# Patient Record
Sex: Female | Born: 1955
Health system: Southern US, Community
[De-identification: ages and names within clinical notes are randomized; demographics above are authoritative.]

## PROBLEM LIST (undated history)

## (undated) DIAGNOSIS — T7840XA Allergy, unspecified, initial encounter: Secondary | ICD-10-CM

## (undated) DIAGNOSIS — K219 Gastro-esophageal reflux disease without esophagitis: Secondary | ICD-10-CM

## (undated) DIAGNOSIS — I10 Essential (primary) hypertension: Secondary | ICD-10-CM

## (undated) DIAGNOSIS — H269 Unspecified cataract: Secondary | ICD-10-CM

## (undated) HISTORY — DX: Unspecified cataract: H26.9

## (undated) HISTORY — PX: TUMOR REMOVAL: SHX12

## (undated) HISTORY — DX: Allergy, unspecified, initial encounter: T78.40XA

## (undated) HISTORY — PX: EYE SURGERY: SHX253

## (undated) HISTORY — PX: APPENDECTOMY: SHX54

## (undated) HISTORY — PX: OTHER SURGICAL HISTORY: SHX169

## (undated) HISTORY — PX: ELBOW SURGERY: SHX618

## (undated) HISTORY — PX: ABDOMINAL HYSTERECTOMY: SHX81

## (undated) HISTORY — PX: JOINT REPLACEMENT: SHX530

## (undated) HISTORY — PX: ANKLE SURGERY: SHX546

## (undated) HISTORY — PX: CHOLECYSTECTOMY: SHX55

---

## 2001-08-16 HISTORY — PX: TOTAL ABDOMINAL HYSTERECTOMY W/ BILATERAL SALPINGOOPHORECTOMY: SHX83

## 2004-11-23 ENCOUNTER — Ambulatory Visit: Payer: Self-pay | Admitting: Pain Medicine

## 2005-03-19 ENCOUNTER — Ambulatory Visit: Payer: Self-pay | Admitting: Family Medicine

## 2005-04-21 ENCOUNTER — Ambulatory Visit: Payer: Self-pay | Admitting: Family Medicine

## 2005-05-04 ENCOUNTER — Ambulatory Visit: Payer: Self-pay | Admitting: Family Medicine

## 2005-07-30 ENCOUNTER — Ambulatory Visit: Payer: Self-pay | Admitting: Gastroenterology

## 2005-08-18 ENCOUNTER — Ambulatory Visit: Payer: Self-pay | Admitting: Gastroenterology

## 2006-09-16 ENCOUNTER — Ambulatory Visit: Payer: Self-pay | Admitting: Family Medicine

## 2008-10-23 ENCOUNTER — Ambulatory Visit: Payer: Self-pay | Admitting: Gastroenterology

## 2008-10-23 LAB — HM COLONOSCOPY

## 2008-10-28 ENCOUNTER — Ambulatory Visit: Payer: Self-pay | Admitting: General Surgery

## 2013-01-12 ENCOUNTER — Encounter (INDEPENDENT_AMBULATORY_CARE_PROVIDER_SITE_OTHER): Payer: BC Managed Care – PPO | Admitting: Ophthalmology

## 2013-01-12 DIAGNOSIS — H3530 Unspecified macular degeneration: Secondary | ICD-10-CM

## 2013-01-12 DIAGNOSIS — H43819 Vitreous degeneration, unspecified eye: Secondary | ICD-10-CM

## 2013-01-12 DIAGNOSIS — D3130 Benign neoplasm of unspecified choroid: Secondary | ICD-10-CM

## 2013-01-12 DIAGNOSIS — H2510 Age-related nuclear cataract, unspecified eye: Secondary | ICD-10-CM

## 2015-05-19 ENCOUNTER — Ambulatory Visit (INDEPENDENT_AMBULATORY_CARE_PROVIDER_SITE_OTHER): Payer: 59 | Admitting: Family Medicine

## 2015-05-19 ENCOUNTER — Encounter: Payer: Self-pay | Admitting: Family Medicine

## 2015-05-19 VITALS — BP 170/96 | HR 73 | Temp 98.8°F | Resp 14 | Wt 187.0 lb

## 2015-05-19 DIAGNOSIS — I10 Essential (primary) hypertension: Secondary | ICD-10-CM | POA: Insufficient documentation

## 2015-05-19 DIAGNOSIS — R0789 Other chest pain: Secondary | ICD-10-CM | POA: Insufficient documentation

## 2015-05-19 MED ORDER — LISINOPRIL 5 MG PO TABS: 5.0000 mg | ORAL_TABLET | Freq: Every day | ORAL | Status: DC

## 2015-05-19 NOTE — Progress Notes (Signed)
Patient ID: Karen Boyd, female   DOB: [birth year], 58 y.o.   MRN: [id number]   [name] Female    DOB: [birth year]   60 y.o.   MRN: [id number] Visit Date: 05/19/2015  Today's Provider: Vernie Murders, PA   Chief Complaint  Patient presents with  . Hypertension   Subjective:    Hypertension This is a new problem. The problem is unchanged. Associated symptoms include headaches. Risk factors for coronary artery disease include family history.   Patient reports she has been taking blood pressure at home. Reading have been 130-140's/80-90's. Patient has cut back on sodas and processed foods. Patient eats a low salt diet. Patient states she went to Urgent Care last week and her blood pressure was 160's/100's.    Previous Medications   NAPROXEN SODIUM (ALEVE) 220 MG TABLET    Take by mouth.   RANITIDINE (ZANTAC) 150 MG TABLET    Take by mouth.   Allergies  Allergen Reactions  . Shellfish Allergy Anaphylaxis    Review of Systems  Constitutional: Negative.   Eyes: Negative.   Respiratory: Negative.   Cardiovascular: Negative.   Gastrointestinal: Negative.   Endocrine: Negative.   Genitourinary: Negative.   Musculoskeletal: Negative.   Skin: Negative.   Allergic/Immunologic: Negative.   Neurological: Positive for headaches.  Hematological: Negative.   Psychiatric/Behavioral: Negative.     Social History  Substance Use Topics  . Smoking status: Never Smoker   . Smokeless tobacco: Never Used  . Alcohol Use: No   Objective:   BP 170/96 mmHg  Pulse 73  Temp(Src) 98.8 F (37.1 C) (Oral)  Resp 14  Wt 187 lb (84.823 kg)   Physical Exam  Constitutional: She is oriented to person, place, and time. She appears well-developed and well-nourished. No distress.  HENT:  Head: Normocephalic and atraumatic.  Right Ear: Hearing normal.  Left Ear: Hearing normal.  Nose: Nose normal.  Eyes: Conjunctivae and lids are normal. Right eye exhibits no discharge.  Left eye exhibits no discharge. No scleral icterus.  Cardiovascular: Normal rate and regular rhythm.   Pulmonary/Chest: Effort normal and breath sounds normal. No respiratory distress.  Abdominal: Bowel sounds are normal. She exhibits no mass.  Musculoskeletal: Normal range of motion. She exhibits tenderness.  Neurological: She is alert and oriented to person, place, and time.  Skin: Skin is intact. No lesion and no rash noted.  Psychiatric: She has a normal mood and affect. Her speech is normal and behavior is normal. Thought content normal.      Assessment & Plan:     1. Essential (primary) hypertension Has been trying to limit sodium intake, restrict fat content of diet, lose weight, exercise and lower caffeine consumption. Fell in a ditch walking at night from a neighbor's house and contused/sprained the left wrist. Martin Majestic to a walk-in clinic over the weekend and found BP was elevated (x-ray negative for fracture). Will start ACE inhibitor and get labs. Had cardiology evaluation Sept. 2016 but had decided to postpone stress test and echocardiogram. Denies dyspnea, edema or chest pains. Continues ASA 81 mg qd. Recheck BP in 2 weeks. - CBC with Differential/Platelet - Comprehensive metabolic panel - lisinopril (PRINIVIL,ZESTRIL) 5 MG tablet; Take 1 tablet (5 mg total) by mouth daily.  Dispense: 30 tablet; Refill: 3

## 2015-05-19 NOTE — Patient Instructions (Signed)
Hypertension Hypertension, commonly called high blood pressure, is when the force of blood pumping through your arteries is too strong. Your arteries are the blood vessels that carry blood from your heart throughout your body. A blood pressure reading consists of a higher number over a lower number, such as 110/72. The higher number (systolic) is the pressure inside your arteries when your heart pumps. The lower number (diastolic) is the pressure inside your arteries when your heart relaxes. Ideally you want your blood pressure below 120/80. Hypertension forces your heart to work harder to pump blood. Your arteries may become narrow or stiff. Having untreated or uncontrolled hypertension can cause heart attack, stroke, kidney disease, and other problems. RISK FACTORS Some risk factors for high blood pressure are controllable. Others are not.  Risk factors you cannot control include:   Race. You may be at higher risk if you are African American.  Age. Risk increases with age.  Gender. Men are at higher risk than women before age 45 years. After age 65, women are at higher risk than men. Risk factors you can control include:  Not getting enough exercise or physical activity.  Being overweight.  Getting too much fat, sugar, calories, or salt in your diet.  Drinking too much alcohol. SIGNS AND SYMPTOMS Hypertension does not usually cause signs or symptoms. Extremely high blood pressure (hypertensive crisis) may cause headache, anxiety, shortness of breath, and nosebleed. DIAGNOSIS To check if you have hypertension, your health care provider will measure your blood pressure while you are seated, with your arm held at the level of your heart. It should be measured at least twice using the same arm. Certain conditions can cause a difference in blood pressure between your right and left arms. A blood pressure reading that is higher than normal on one occasion does not mean that you need treatment. If  it is not clear whether you have high blood pressure, you may be asked to return on a different day to have your blood pressure checked again. Or, you may be asked to monitor your blood pressure at home for 1 or more weeks. TREATMENT Treating high blood pressure includes making lifestyle changes and possibly taking medicine. Living a healthy lifestyle can help lower high blood pressure. You may need to change some of your habits. Lifestyle changes may include:  Following the DASH diet. This diet is high in fruits, vegetables, and whole grains. It is low in salt, red meat, and added sugars.  Keep your sodium intake below 2,300 mg per day.  Getting at least 30-45 minutes of aerobic exercise at least 4 times per week.  Losing weight if necessary.  Not smoking.  Limiting alcoholic beverages.  Learning ways to reduce stress. Your health care provider may prescribe medicine if lifestyle changes are not enough to get your blood pressure under control, and if one of the following is true:  You are 18-59 years of age and your systolic blood pressure is above 140.  You are 60 years of age or older, and your systolic blood pressure is above 150.  Your diastolic blood pressure is above 90.  You have diabetes, and your systolic blood pressure is over 140 or your diastolic blood pressure is over 90.  You have kidney disease and your blood pressure is above 140/90.  You have heart disease and your blood pressure is above 140/90. Your personal target blood pressure may vary depending on your medical conditions, your age, and other factors. HOME CARE INSTRUCTIONS    Have your blood pressure rechecked as directed by your health care provider.   Take medicines only as directed by your health care provider. Follow the directions carefully. Blood pressure medicines must be taken as prescribed. The medicine does not work as well when you skip doses. Skipping doses also puts you at risk for  problems.  Do not smoke.   Monitor your blood pressure at home as directed by your health care provider. SEEK MEDICAL CARE IF:   You think you are having a reaction to medicines taken.  You have recurrent headaches or feel dizzy.  You have swelling in your ankles.  You have trouble with your vision. SEEK IMMEDIATE MEDICAL CARE IF:  You develop a severe headache or confusion.  You have unusual weakness, numbness, or feel faint.  You have severe chest or abdominal pain.  You vomit repeatedly.  You have trouble breathing. MAKE SURE YOU:   Understand these instructions.  Will watch your condition.  Will get help right away if you are not doing well or get worse.   This information is not intended to replace advice given to you by your health care provider. Make sure you discuss any questions you have with your health care provider.   Document Released: 01/18/2005 Document Revised: 06/04/2014 Document Reviewed: 11/10/2012 Elsevier Interactive Patient Education 2016 [employer]  

## 2015-05-21 LAB — CBC WITH DIFFERENTIAL/PLATELET
BASOS ABS: 0 10*3/uL (ref 0.0–0.2)
Basos: 0 %
EOS (ABSOLUTE): 0.2 10*3/uL (ref 0.0–0.4)
EOS: 3 %
HEMOGLOBIN: 12.5 g/dL (ref 11.1–15.9)
Hematocrit: 36.7 % (ref 34.0–46.6)
IMMATURE GRANS (ABS): 0 10*3/uL (ref 0.0–0.1)
Immature Granulocytes: 0 %
LYMPHS: 41 %
Lymphocytes Absolute: 2.3 10*3/uL (ref 0.7–3.1)
MCH: 29.9 pg (ref 26.6–33.0)
MCHC: 34.1 g/dL (ref 31.5–35.7)
MCV: 88 fL (ref 79–97)
MONOCYTES: 7 %
Monocytes Absolute: 0.4 10*3/uL (ref 0.1–0.9)
Neutrophils Absolute: 2.7 10*3/uL (ref 1.4–7.0)
Neutrophils: 49 %
Platelets: 350 10*3/uL (ref 150–379)
RBC: 4.18 x10E6/uL (ref 3.77–5.28)
RDW: 13.4 % (ref 12.3–15.4)
WBC: 5.5 10*3/uL (ref 3.4–10.8)

## 2015-05-21 LAB — COMPREHENSIVE METABOLIC PANEL
A/G RATIO: 1.4 (ref 1.2–2.2)
ALBUMIN: 4.2 g/dL (ref 3.5–5.5)
ALK PHOS: 105 IU/L (ref 39–117)
ALT: 73 IU/L — ABNORMAL HIGH (ref 0–32)
AST: 70 IU/L — ABNORMAL HIGH (ref 0–40)
BILIRUBIN TOTAL: 0.3 mg/dL (ref 0.0–1.2)
BUN / CREAT RATIO: 26 — AB (ref 9–23)
BUN: 18 mg/dL (ref 6–24)
CHLORIDE: 103 mmol/L (ref 96–106)
CO2: 23 mmol/L (ref 18–29)
Calcium: 9 mg/dL (ref 8.7–10.2)
Creatinine, Ser: 0.68 mg/dL (ref 0.57–1.00)
GFR calc Af Amer: 112 mL/min/{1.73_m2} (ref >59)
GFR calc non Af Amer: 97 mL/min/{1.73_m2} (ref >59)
Globulin, Total: 2.9 g/dL (ref 1.5–4.5)
Glucose: 115 mg/dL — ABNORMAL HIGH (ref 65–99)
POTASSIUM: 4.3 mmol/L (ref 3.5–5.2)
SODIUM: 141 mmol/L (ref 134–144)
Total Protein: 7.1 g/dL (ref 6.0–8.5)

## 2015-05-22 ENCOUNTER — Telehealth: Payer: Self-pay

## 2015-05-22 NOTE — Telephone Encounter (Signed)
Patient advised as directed below. Patient verbalized understanding and agrees with plan of care.  

## 2015-05-22 NOTE — Telephone Encounter (Signed)
-----   Message from Margo Common, [place] sent at 05/22/2015  9:16 AM EDT ----- All blood tests normal except elevation of liver enzymes. Recommend increase in water intake and recheck count in 1 month to be sure is settles down and not an indication of an impending condition.

## 2015-06-02 ENCOUNTER — Encounter: Payer: Self-pay | Admitting: Family Medicine

## 2015-06-02 ENCOUNTER — Ambulatory Visit (INDEPENDENT_AMBULATORY_CARE_PROVIDER_SITE_OTHER): Payer: 59 | Admitting: Family Medicine

## 2015-06-02 VITALS — BP 142/90 | HR 64 | Temp 98.0°F | Resp 16 | Wt 187.4 lb

## 2015-06-02 DIAGNOSIS — I10 Essential (primary) hypertension: Secondary | ICD-10-CM

## 2015-06-02 DIAGNOSIS — R748 Abnormal levels of other serum enzymes: Secondary | ICD-10-CM

## 2015-06-02 NOTE — Progress Notes (Signed)
Patient ID: Karen Boyd, female   DOB: 09-28-1955, 60 y.o.   MRN: [id number]   [name] Female    DOB: 10-13-1955   60 y.o.   MRN: [id number] Visit Date: 06/02/2015  Today's Provider: Vernie Murders, PA   Chief Complaint  Patient presents with  . Hypertension  . Follow-up   Subjective:    HPI  Hypertension, follow-up:  BP Readings from Last 3 Encounters:  06/02/15 142/90  05/19/15 170/96    She was last seen for hypertension 2 weeks ago.  BP at that visit was 170/96. Management changes since that visit include started Lisinopril 5 mg at [facility] on 05/19/2015. She reports good compliance with treatment. She is not having side effects.  She is exercising. She is adherent to low salt diet.   Outside blood pressures are being checked. She is experiencing none.  Patient denies none.   Cardiovascular risk factors include none.  Use of agents associated with hypertension: none.     Weight trend: stable Wt Readings from Last 3 Encounters:  06/02/15 187 lb 6.4 oz (85.004 kg)  05/19/15 187 lb (84.823 kg)    Current diet: well balanced, low salt  ------------------------------------------------------------------------  Patient Active Problem List   Diagnosis Date Noted  . Chest pressure 05/19/2015  . Essential (primary) hypertension 05/19/2015   Past Surgical History  Procedure Laterality Date  . Abdominal hysterectomy    . Ankle surgery    . Appendectomy    . Elbow surgery Left   . Tumor removal      abdomen   Family History  Problem Relation Age of Onset  . Hypertension Mother   . Diabetes Mother   . Gout Mother   . Heart failure Father   . Ovarian cancer Maternal Grandmother   . Cirrhosis Paternal Grandfather      Previous Medications   LISINOPRIL (PRINIVIL,ZESTRIL) 5 MG TABLET    Take 1 tablet (5 mg total) by mouth daily.   NAPROXEN SODIUM (ALEVE) 220 MG TABLET    Take by mouth.   RANITIDINE (ZANTAC) 150 MG TABLET    Take by mouth.     Allergies  Allergen Reactions  . Shellfish Allergy Anaphylaxis    Review of Systems  Constitutional: Negative.   HENT: Negative.   Eyes: Negative.   Respiratory: Negative.   Cardiovascular: Negative.   Gastrointestinal: Negative.   Endocrine: Negative.   Genitourinary: Negative.   Musculoskeletal: Negative.   Skin: Negative.   Allergic/Immunologic: Negative.   Neurological: Negative.   Hematological: Negative.   Psychiatric/Behavioral: Negative.     Social History  Substance Use Topics  . Smoking status: Never Smoker   . Smokeless tobacco: Never Used  . Alcohol Use: No   Objective:   BP 142/90 mmHg  Pulse 64  Temp(Src) 98 F (36.7 C) (Oral)  Resp 16  Wt 187 lb 6.4 oz (85.004 kg)  Physical Exam  Constitutional: She is oriented to person, place, and time. She appears well-developed and well-nourished. No distress.  HENT:  Head: Normocephalic and atraumatic.  Right Ear: Hearing normal.  Left Ear: Hearing normal.  Nose: Nose normal.  Eyes: Conjunctivae and lids are normal. Right eye exhibits no discharge. Left eye exhibits no discharge. No scleral icterus.  Pulmonary/Chest: Effort normal. No respiratory distress.  Musculoskeletal: Normal range of motion.  Neurological: She is alert and oriented to person, place, and time.  Skin: Skin is intact. No lesion and no rash noted.  Psychiatric: She has a normal mood and  affect. Her speech is normal and behavior is normal. Thought content normal.      Assessment & Plan:     1. Essential (primary) hypertension Tolerating Lisinopril 5 mg qd without side effects. Following sodium restriction and limitation on caffenated beverages. Exercising more and feeling well. Much improved BP reading today. Continue present dosage and recheck in 1 month.   2. Elevated liver enzymes Recent Results (from the past 2160 hour(s))  CBC with Differential/Platelet     Status: None   Collection Time: 05/20/15  2:35 PM  Result Value Ref  Range   WBC 5.5 3.4 - 10.8 x10E3/uL   RBC 4.18 3.77 - 5.28 x10E6/uL   Hemoglobin 12.5 11.1 - 15.9 g/dL   Hematocrit 36.7 34.0 - 46.6 %   MCV 88 79 - 97 fL   MCH 29.9 26.6 - 33.0 pg   MCHC 34.1 31.5 - 35.7 g/dL   RDW 13.4 12.3 - 15.4 %   Platelets 350 150 - 379 x10E3/uL   Neutrophils 49 %   Lymphs 41 %   Monocytes 7 %   Eos 3 %   Basos 0 %   Neutrophils Absolute 2.7 1.4 - 7.0 x10E3/uL   Lymphocytes Absolute 2.3 0.7 - 3.1 x10E3/uL   Monocytes Absolute 0.4 0.1 - 0.9 x10E3/uL   EOS (ABSOLUTE) 0.2 0.0 - 0.4 x10E3/uL   Basophils Absolute 0.0 0.0 - 0.2 x10E3/uL   Immature Granulocytes 0 %   Immature Grans (Abs) 0.0 0.0 - 0.1 x10E3/uL  Comprehensive metabolic panel     Status: Abnormal   Collection Time: 05/20/15  2:35 PM  Result Value Ref Range   Glucose 115 (H) 65 - 99 mg/dL   BUN 18 6 - 24 mg/dL   Creatinine, Ser 0.68 0.57 - 1.00 mg/dL   GFR calc non Af Amer 97 >59 mL/min/1.73   GFR calc Af Amer 112 >59 mL/min/1.73   BUN/Creatinine Ratio 26 (H) 9 - 23   Sodium 141 134 - 144 mmol/L   Potassium 4.3 3.5 - 5.2 mmol/L   Chloride 103 96 - 106 mmol/L   CO2 23 18 - 29 mmol/L   Calcium 9.0 8.7 - 10.2 mg/dL   Total Protein 7.1 6.0 - 8.5 g/dL   Albumin 4.2 3.5 - 5.5 g/dL   Globulin, Total 2.9 1.5 - 4.5 g/dL   Albumin/Globulin Ratio 1.4 1.2 - 2.2   Bilirubin Total 0.3 0.0 - 1.2 mg/dL   Alkaline Phosphatase 105 39 - 117 IU/L   AST 70 (H) 0 - 40 IU/L   ALT 73 (H) 0 - 32 IU/L   Recommend lower fats and increase in water intake. Recheck liver function tests in 1 month.

## 2015-07-01 ENCOUNTER — Ambulatory Visit: Payer: 59 | Admitting: Family Medicine

## 2015-07-08 ENCOUNTER — Encounter: Payer: Self-pay | Admitting: Family Medicine

## 2015-07-08 ENCOUNTER — Ambulatory Visit (INDEPENDENT_AMBULATORY_CARE_PROVIDER_SITE_OTHER): Payer: 59 | Admitting: Family Medicine

## 2015-07-08 VITALS — BP 128/82 | HR 84 | Temp 98.8°F | Resp 16 | Wt 181.0 lb

## 2015-07-08 DIAGNOSIS — I10 Essential (primary) hypertension: Secondary | ICD-10-CM | POA: Diagnosis not present

## 2015-07-08 DIAGNOSIS — R748 Abnormal levels of other serum enzymes: Secondary | ICD-10-CM

## 2015-07-08 MED ORDER — LISINOPRIL 5 MG PO TABS: 5.0000 mg | ORAL_TABLET | Freq: Every day | ORAL | Status: DC

## 2015-07-08 NOTE — Progress Notes (Signed)
Patient ID: Karen Boyd, female   DOB: February 15, 1955, 60 y.o.   MRN: [id number]       [name] Female    DOB: 25-Jul-1955   60 y.o.   MRN: [id number] Visit Date: 07/08/2015  Today's Provider: Vernie Murders, PA   Chief Complaint  Patient presents with  . Hypertension    1 month FU.    Subjective:    HPI  Hypertension, follow-up:  BP Readings from Last 3 Encounters:  07/08/15 128/82  06/02/15 142/90  05/19/15 170/96    She was last seen for hypertension 1 months ago.  BP at that visit was 142/90. Management since that visit includes continuing Lisinopril 5mg . She reports good compliance with treatment. She is having side effects. Patient reports that she has a mild cough.   She is exercising. She is adherent to low salt diet.   Outside blood pressures are 130s/80s. Patient denies chest pain, fatigue, irregular heart beat, palpitations and tachypnea.    Weight trend: stable Wt Readings from Last 3 Encounters:  07/08/15 181 lb (82.101 kg)  06/02/15 187 lb 6.4 oz (85.004 kg)  05/19/15 187 lb (84.823 kg)    Current diet: in general, a "healthy" diet      Patient Active Problem List   Diagnosis Date Noted  . Chest pressure 05/19/2015  . Essential (primary) hypertension 05/19/2015   Past Surgical History  Procedure Laterality Date  . Abdominal hysterectomy    . Ankle surgery    . Appendectomy    . Elbow surgery Left   . Tumor removal      abdomen   Family History  Problem Relation Age of Onset  . Hypertension Mother   . Diabetes Mother   . Gout Mother   . Heart failure Father   . Ovarian cancer Maternal Grandmother   . Cirrhosis Paternal Grandfather     Allergies  Allergen Reactions  . Shellfish Allergy Anaphylaxis   Previous Medications   LISINOPRIL (PRINIVIL,ZESTRIL) 5 MG TABLET    Take 1 tablet (5 mg total) by mouth daily.   NAPROXEN SODIUM (ALEVE) 220 MG TABLET    Take by mouth.   RANITIDINE (ZANTAC) 150 MG TABLET     Take by mouth.    Review of Systems  Constitutional: Negative.   Respiratory: Positive for cough. Negative for apnea, choking, chest tightness, shortness of breath, wheezing and stridor.   Cardiovascular: Negative for chest pain, palpitations and leg swelling.    Social History  Substance Use Topics  . Smoking status: Never Smoker   . Smokeless tobacco: Never Used  . Alcohol Use: No   Objective:   BP 128/82 mmHg  Pulse 84  Temp(Src) 98.8 F (37.1 C)  Resp 16  Wt 181 lb (82.101 kg)  Physical Exam  Constitutional: She is oriented to person, place, and time. She appears well-developed and well-nourished. No distress.  HENT:  Head: Normocephalic and atraumatic.  Right Ear: Hearing normal.  Left Ear: Hearing normal.  Nose: Nose normal.  Eyes: Conjunctivae and lids are normal. Right eye exhibits no discharge. Left eye exhibits no discharge. No scleral icterus.  Cardiovascular: Normal rate and regular rhythm.   Pulmonary/Chest: Effort normal and breath sounds normal. No respiratory distress.  Abdominal: Soft. Bowel sounds are normal.  Musculoskeletal: Normal range of motion.  Neurological: She is alert and oriented to person, place, and time.  Skin: Skin is intact. No lesion and no rash noted.  Psychiatric: She has a normal mood and affect. Her  speech is normal and behavior is normal. Thought content normal.      Assessment & Plan:     1. Essential (primary) hypertension Greatly improved BP. Denies muscle cramps of significance and only a slight cough occasionally that she associates with allergy exposure. Will continue Lisinopril and encouraged to drink extra fluids. Recheck in 4 months. - lisinopril (PRINIVIL,ZESTRIL) 5 MG tablet; Take 1 tablet (5 mg total) by mouth daily.  Dispense: 30 tablet; Refill: 3  2. Elevated liver enzymes Labs on 05-20-15 showed elevation of AST to 70  and ALT to 73. Encouraged to drink extra fluids and recheck CMP. - Comprehensive metabolic  panel       Vernie Murders, PA  [facility]

## 2015-07-09 LAB — COMPREHENSIVE METABOLIC PANEL
ALBUMIN: 4.4 g/dL (ref 3.5–5.5)
ALK PHOS: 102 IU/L (ref 39–117)
ALT: 18 IU/L (ref 0–32)
AST: 24 IU/L (ref 0–40)
Albumin/Globulin Ratio: 1.7 (ref 1.2–2.2)
BILIRUBIN TOTAL: 0.2 mg/dL (ref 0.0–1.2)
BUN / CREAT RATIO: 19 (ref 9–23)
BUN: 14 mg/dL (ref 6–24)
CO2: 23 mmol/L (ref 18–29)
CREATININE: 0.74 mg/dL (ref 0.57–1.00)
Calcium: 9.5 mg/dL (ref 8.7–10.2)
Chloride: 102 mmol/L (ref 96–106)
GFR calc non Af Amer: 90 mL/min/{1.73_m2} (ref >59)
GFR, EST AFRICAN AMERICAN: 104 mL/min/{1.73_m2} (ref >59)
GLOBULIN, TOTAL: 2.6 g/dL (ref 1.5–4.5)
GLUCOSE: 92 mg/dL (ref 65–99)
Potassium: 4.8 mmol/L (ref 3.5–5.2)
SODIUM: 141 mmol/L (ref 134–144)
TOTAL PROTEIN: 7 g/dL (ref 6.0–8.5)

## 2015-07-10 ENCOUNTER — Telehealth: Payer: Self-pay

## 2015-07-10 NOTE — Telephone Encounter (Signed)
Left message to call back  

## 2015-07-10 NOTE — Telephone Encounter (Signed)
-----   Message from Margo Common, [place] sent at 07/10/2015  9:12 AM EDT ----- Liver enzymes back to normal. All blood tests are normal now. Recheck BP in 4 months as planned.

## 2015-07-15 NOTE — Telephone Encounter (Signed)
Pt advised on voicemail-aa 

## 2015-12-30 ENCOUNTER — Other Ambulatory Visit: Payer: Self-pay | Admitting: Family Medicine

## 2015-12-30 DIAGNOSIS — I10 Essential (primary) hypertension: Secondary | ICD-10-CM

## 2016-02-29 DIAGNOSIS — R05 Cough: Secondary | ICD-10-CM | POA: Diagnosis not present

## 2016-04-30 ENCOUNTER — Other Ambulatory Visit: Payer: Self-pay | Admitting: Family Medicine

## 2016-04-30 DIAGNOSIS — I10 Essential (primary) hypertension: Secondary | ICD-10-CM

## 2016-05-22 ENCOUNTER — Emergency Department: Payer: 59

## 2016-05-22 ENCOUNTER — Encounter: Payer: Self-pay | Admitting: Medical Oncology

## 2016-05-22 ENCOUNTER — Emergency Department
Admission: EM | Admit: 2016-05-22 | Discharge: 2016-05-22 | Disposition: A | Discharge status: Home / Self Care | Payer: 59 | Attending: Emergency Medicine | Admitting: Emergency Medicine

## 2016-05-22 DIAGNOSIS — W1839XA Other fall on same level, initial encounter: Secondary | ICD-10-CM | POA: Insufficient documentation

## 2016-05-22 DIAGNOSIS — Y999 Unspecified external cause status: Secondary | ICD-10-CM | POA: Insufficient documentation

## 2016-05-22 DIAGNOSIS — S0101XA Laceration without foreign body of scalp, initial encounter: Secondary | ICD-10-CM

## 2016-05-22 DIAGNOSIS — I10 Essential (primary) hypertension: Secondary | ICD-10-CM | POA: Insufficient documentation

## 2016-05-22 DIAGNOSIS — S61217A Laceration without foreign body of left little finger without damage to nail, initial encounter: Secondary | ICD-10-CM | POA: Insufficient documentation

## 2016-05-22 DIAGNOSIS — Z79899 Other long term (current) drug therapy: Secondary | ICD-10-CM | POA: Insufficient documentation

## 2016-05-22 DIAGNOSIS — Y929 Unspecified place or not applicable: Secondary | ICD-10-CM | POA: Insufficient documentation

## 2016-05-22 DIAGNOSIS — S0181XA Laceration without foreign body of other part of head, initial encounter: Secondary | ICD-10-CM | POA: Diagnosis not present

## 2016-05-22 DIAGNOSIS — Y9389 Activity, other specified: Secondary | ICD-10-CM | POA: Diagnosis not present

## 2016-05-22 DIAGNOSIS — S0990XA Unspecified injury of head, initial encounter: Secondary | ICD-10-CM | POA: Diagnosis not present

## 2016-05-22 HISTORY — DX: Essential (primary) hypertension: I10

## 2016-05-22 MED ORDER — LIDOCAINE-EPINEPHRINE-TETRACAINE (LET) SOLUTION
3.0000 mL | Freq: Once | NASAL | Status: AC
  Administered 2016-05-22: 3 mL via TOPICAL

## 2016-05-22 MED ORDER — LIDOCAINE HCL (PF) 1 % IJ SOLN
INTRAMUSCULAR | Status: AC
  Administered 2016-05-22: 5 mL via INTRADERMAL
  Filled 2016-05-22: qty 10

## 2016-05-22 MED ORDER — CLINDAMYCIN PHOSPHATE 900 MG/6ML IJ SOLN
600.0000 mg | Freq: Once | INTRAMUSCULAR | Status: AC
  Administered 2016-05-22: 600 mg via INTRAMUSCULAR

## 2016-05-22 MED ORDER — LIDOCAINE-EPINEPHRINE-TETRACAINE (LET) SOLUTION
NASAL | Status: AC
Start: 2016-05-22 — End: 2016-05-22
  Administered 2016-05-22: 3 mL via TOPICAL
  Filled 2016-05-22: qty 3

## 2016-05-22 MED ORDER — CLINDAMYCIN HCL 300 MG PO CAPS: 300.0000 mg | ORAL_CAPSULE | Freq: Four times a day (QID) | ORAL | 0 refills | Status: AC

## 2016-05-22 MED ORDER — CLINDAMYCIN PHOSPHATE 300 MG/2ML IJ SOLN
INTRAMUSCULAR | Status: AC
  Filled 2016-05-22: qty 2

## 2016-05-22 MED ORDER — LIDOCAINE HCL (PF) 1 % IJ SOLN
5.0000 mL | Freq: Once | INTRAMUSCULAR | Status: AC
  Administered 2016-05-22: 5 mL via INTRADERMAL

## 2016-05-22 NOTE — ED Notes (Signed)
Patient denies LOC or blurry vision immediately after and since the injury. Patient was able to do chores after the fall.

## 2016-05-22 NOTE — ED Notes (Signed)
Pt discharged to home.  Family member driving.  Discharge instructions reviewed.  Verbalized understanding.  No questions or concerns at this time.  Teach back verified.  Pt in NAD.  No items left in ED.   

## 2016-05-22 NOTE — ED Provider Notes (Signed)
[facility] Emergency Department Provider Note  ____________________________________________  Time seen: Approximately 12:10 PM  I have reviewed the triage vital signs and the nursing notes.   HISTORY  Chief Complaint Fall and Head Laceration    HPI Karen Boyd is a 61 y.o. female that presents to emergency department with head laceration and finger laceration after falling.Patient states that she was trying to load a lawnmower onto the back of a truck when she lost control of it and fell backwards. She hit her forehead on the ground. She did not lose consciousness. She continued working after falling. She cleaned out her forehead and finger the best she could. She takes aspirin daily. Last tetanus shot was 2 years ago. She denies fever, headache, visual changes, shortness of breath, chest pain, nausea, vomiting, abdominal pain.   Past Medical History:  Diagnosis Date  . Hypertension     Patient Active Problem List   Diagnosis Date Noted  . Chest pressure 05/19/2015  . Essential (primary) hypertension 05/19/2015    Past Surgical History:  Procedure Laterality Date  . ABDOMINAL HYSTERECTOMY    . ANKLE SURGERY    . APPENDECTOMY    . ELBOW SURGERY Left   . TUMOR REMOVAL     abdomen    Prior to Admission medications   Medication Sig Start Date End Date Taking? Authorizing Provider  clindamycin (CLEOCIN) 300 MG capsule Take 1 capsule (300 mg total) by mouth 4 (four) times daily. 05/22/16 06/01/16  Laban Emperor, PA-C  lisinopril (PRINIVIL,ZESTRIL) 5 MG tablet TAKE 1 TABLET (5 MG TOTAL) BY MOUTH DAILY. 04/30/16   Vickki Muff Chrismon, PA  naproxen sodium (ALEVE) 220 MG tablet Take by mouth.    Historical Provider, MD  ranitidine (ZANTAC) 150 MG tablet Take by mouth.    Historical Provider, MD    Allergies Shellfish allergy  Family History  Problem Relation Age of Onset  . Heart failure Father   . Ovarian cancer Maternal Grandmother   .  Cirrhosis Paternal Grandfather   . Hypertension Mother   . Diabetes Mother   . Gout Mother     Social History Social History  Substance Use Topics  . Smoking status: Never Smoker  . Smokeless tobacco: Never Used  . Alcohol use No     Review of Systems  Constitutional: No fever/chills ENT: No upper respiratory complaints. Cardiovascular: No chest pain. Respiratory: No SOB. Gastrointestinal: No abdominal pain.  No nausea, no vomiting.  Skin: Negative for rash, ecchymosis. Positive for laceration. Neurological: Negative for headaches, numbness or tingling   ____________________________________________   PHYSICAL EXAM:  VITAL SIGNS: ED Triage Vitals  Enc Vitals Group     BP 05/22/16 1137 (!) 179/78     Pulse Rate 05/22/16 1137 80     Resp 05/22/16 1137 17     Temp 05/22/16 1137 97.7 F (36.5 C)     Temp Source 05/22/16 1137 Oral     SpO2 05/22/16 1137 96 %     Weight 05/22/16 1135 169 lb 12.1 oz (77 kg)     Height 05/22/16 1135 5\' 5"  (1.651 m)     Head Circumference --      Peak Flow --      Pain Score 05/22/16 1135 4     Pain Loc --      Pain Edu? --      Excl. in [place]? --      Constitutional: Alert and oriented. Well appearing and in no acute distress. Eyes:  Conjunctivae are normal. PERRL. EOMI. Head: Atraumatic. ENT:      Ears:      Nose: No congestion/rhinnorhea.      Mouth/Throat: Mucous membranes are moist.  Neck: No stridor.  No cervical spine tenderness to palpation. Cardiovascular: Normal rate, regular rhythm.  Good peripheral circulation. Respiratory: Normal respiratory effort without tachypnea or retractions. Lungs CTAB. Good air entry to the bases with no decreased or absent breath sounds. Gastrointestinal: Bowel sounds 4 quadrants. Soft and nontender to palpation.  Musculoskeletal: Full range of motion to all extremities. No gross deformities appreciated. Neurologic:  Normal speech and language. No gross focal neurologic deficits are appreciated.   Skin:  Skin is warm, dry. 2 inch laceration above left eyebrow. 1/2 cm shave laceration to left pinky finger. Wounds severely impacted with dirt and rocks.   ____________________________________________   LABS (all labs ordered are listed, but only abnormal results are displayed)  Labs Reviewed - No data to display ____________________________________________  EKG   ____________________________________________  RADIOLOGY   No results found.  ____________________________________________    PROCEDURES  Procedure(s) performed:    Procedures  LACERATION REPAIR Performed by: Laban Emperor  Consent: Verbal consent obtained.  Consent given by: patient  Prepped and Draped in normal sterile fashion  Wound explored: No foreign bodies   Laceration Location: forehead  Laceration Length: 2 inches  Anesthesia: None  Local anesthetic: lidocaine 1% without epinephrine  Anesthetic total: 8 ml  Irrigation method: syringe  Amount of cleaning: 6 L normal saline Wound was cleaned for 30+ minutes with 6L normal saline using both a plunger and a 22 gauge needle. All visible debris was removed.   Skin closure: 4-0 nylon  Number of sutures: 11  Technique: Simple interrupted  Patient tolerance: Patient tolerated the procedure well with no immediate complications.  Medications  clindamycin (CLEOCIN) injection 600 mg (600 mg Intramuscular Given 05/22/16 1427)  lidocaine-EPINEPHrine-tetracaine (LET) solution (3 mLs Topical Given by Other 05/22/16 1431)  lidocaine (PF) (XYLOCAINE) 1 % injection 5 mL (5 mLs Intradermal Given by Other 05/22/16 1431)     ____________________________________________   INITIAL IMPRESSION / ASSESSMENT AND PLAN / ED COURSE  Pertinent labs & imaging results that were available during my care of the patient were reviewed by me and considered in my medical decision making (see chart for details).  Review of the [place] CSRS was performed in  accordance of the [place] prior to dispensing any controlled drugs.     Patient's diagnosis is consistent with forehead laceration and finger laceration.Vital signs and exam are reassuring. CT negative for acute abnormalities. Wound was cleaned with 6L NS for 30+ minutes. Laceration was repaired with stitches. Finger was bandaged. I do not think there is any indication for repair of shave laceration on finger. IM Clindamycin was given. Given how impacted with rocks and dirt wound was, I am concerned for infection. Extensive education was provided and patient will return for any signs of infection. Tetanus shot is up-to-date. Patient will be discharged home with prescriptions for clindamycin. Patient is to follow up with PCP as directed. Patient is given ED precautions to return to the ED for any worsening or new symptoms.     ____________________________________________  FINAL CLINICAL IMPRESSION(S) / ED DIAGNOSES  Final diagnoses:  Laceration of scalp without foreign body, initial encounter  Laceration of left little finger without foreign body without damage to nail, initial encounter      NEW MEDICATIONS STARTED DURING THIS VISIT:  Discharge Medication List as of  05/22/2016  2:21 PM    START taking these medications   Details  clindamycin (CLEOCIN) 300 MG capsule Take 1 capsule (300 mg total) by mouth 4 (four) times daily., Starting Sat 05/22/2016, Until Tue 06/01/2016, Print            This chart was dictated using voice recognition software/Dragon. Despite best efforts to proofread, errors can occur which can change the meaning. Any change was purely unintentional.    Laban Emperor, PA-C 05/24/16 [id number]    Lavonia Drafts, MD 05/24/16 [phone]

## 2016-05-22 NOTE — ED Triage Notes (Signed)
Pt reports that she was loading a lawn mower into the back of a truck when she slipped and fell, hit her forehead on ground, has lac to fore head and to 5th finger on left hand. Pt denies LOC, no use of blood thinner.

## 2016-05-22 NOTE — ED Notes (Signed)
Pt notified of 20 minute med hold, pt understands to let nurse know when she leaves at 1447

## 2016-05-27 ENCOUNTER — Ambulatory Visit (INDEPENDENT_AMBULATORY_CARE_PROVIDER_SITE_OTHER): Payer: 59 | Admitting: Family Medicine

## 2016-05-27 ENCOUNTER — Encounter: Payer: Self-pay | Admitting: Family Medicine

## 2016-05-27 VITALS — BP 152/88 | HR 86 | Temp 98.2°F | Wt 170.0 lb

## 2016-05-27 DIAGNOSIS — S0101XD Laceration without foreign body of scalp, subsequent encounter: Secondary | ICD-10-CM | POA: Diagnosis not present

## 2016-05-27 NOTE — Patient Instructions (Signed)
Facial Laceration A facial laceration is a cut on the face. The injuries can be painful and can cause bleeding. Lacerations usually heal quickly, but they need special care to reduce scarring. How is this diagnosed? This condition is diagnosed by:  Medical history. Your health care provider will ask for details about how the injury occurred.  Physical exam. Your health care provider will examine the wound to determine how deep the cut is. How is this treated? Treatment for this condition depends on the severity of the cut, including the risk of infection and how deep the wound is.  The wound will be cleaned to prevent infection.  Your health care provider will decide whether to close the wound. Whether the wound will be closed will depend on:  The risk of infection. If there is an increased risk of infection, the wound will not be closed.  The amount of time since the injury occurred. The chance of a successful closure will depend on how old the wound is.  If closure is appropriate:  Your health care provider will use stitches (sutures), wound glue (adhesive), or skin adhesive strips to repair the laceration.  These tools will bring the skin edges together to allow for faster healing and a better cosmetic outcome.  You may be given pain medicine.  If needed, you may also be given a tetanus shot. Follow these instructions at home:  Take over-the-counter and prescription medicines only as told by your health care provider.  Follow your health care provider's instructions for wound care. These instructions will vary depending on the technique used for closing the wound. For sutures:   Keep the wound clean and dry.  If you were given a bandage (dressing), you should change it at least once a day. Also change the dressing if it becomes wet or dirty, or as directed by your health care provider.  Wash the wound with soap and water 2 times a day. Rinse the wound off with water to  remove all soap. Pat the wound dry with a clean towel.  After cleaning, apply a thin layer of the antibiotic ointment recommended by your health care provider. This will help prevent infection and keep the dressing from sticking.  You may shower as usual after the first 24 hours. Do not soak the wound in water until the sutures are removed.  Get your sutures removed as directed by your health care provider. With facial lacerations, sutures should be taken out after 4-5 days to avoid stitch marks.  Wait a few days after your sutures are removed before applying any makeup. For skin adhesive strips:   Keep the wound clean and dry.  Do not get the skin adhesive strips wet. You may bathe carefully, using caution to keep the wound dry.  If the wound gets wet, pat it dry with a clean towel.  Skin adhesive strips will fall off on their own. You may trim the strips as the wound heals. Do not remove skin adhesive strips that are still stuck to the wound. With time, they will fall off on their own. For wound adhesive:   You may briefly wet your wound in the shower or bath:  Do not soak or scrub the wound.  Do not swim.  Avoid periods of heavy sweating until the skin adhesive has fallen off on its own.  After showering or bathing, gently pat the wound dry with a clean towel.  Do not apply liquid medicine, cream medicine, ointment medicine, or  makeup to your wound while the skin adhesive is in place. This may loosen the film before your wound is healed.  If a dressing is placed over the wound, be careful not to apply tape directly over the skin adhesive. This may cause the adhesive to be pulled off before the wound is healed.  Avoid prolonged exposure to sunlight or tanning lamps while the skin adhesive is in place.  The skin adhesive will usually remain in place for 5-10 days, then naturally fall off the skin. Do not pick at the adhesive film. After healing:   Once the wound has  healed:  Cover the wound with sunscreen during the day for 1 full year. This can help minimize scarring.  Exposure to ultraviolet light in the first year will darken the scar.  It can take 1-2 years for the scar to lose its redness and to heal completely. Contact a health care provider if:  You have a fever.  You see ayellowish-white fluid (pus) coming from the wound. Get help right away if:  You have redness, pain, or swelling around the wound. This information is not intended to replace advice given to you by your health care provider. Make sure you discuss any questions you have with your health care provider. Document Released: 02/26/2004 Document Revised: 08/28/2015 Document Reviewed: 08/31/2012 Elsevier Interactive Patient Education  2017 [employer].

## 2016-05-27 NOTE — Progress Notes (Signed)
[name] Female    DOB: 03/22/1955   61 y.o.   MRN: [id number] Visit Date: 05/27/2016  Today's Provider: Vernie Murders, PA   Chief Complaint  Patient presents with  . Suture / Staple Removal   Subjective:    Suture / Staple Removal  Treated in ED: 05/22/2016. She tried oral antibiotics (started Clindamycin 300 mg on 05/22/2016) since the wound repair. The treatment provided significant relief. Her temperature was unmeasured prior to arrival. There has been no drainage from the wound. The redness has improved. The swelling has improved. There is no pain present. She has no difficulty moving the affected extremity or digit.   Patient Active Problem List   Diagnosis Date Noted  . Chest pressure 05/19/2015  . Essential (primary) hypertension 05/19/2015   Past Surgical History:  Procedure Laterality Date  . ABDOMINAL HYSTERECTOMY    . ANKLE SURGERY    . APPENDECTOMY    . ELBOW SURGERY Left   . TUMOR REMOVAL     abdomen   . Family History  Problem Relation Age of Onset  . Heart failure Father   . Ovarian cancer Maternal Grandmother   . Cirrhosis Paternal Grandfather   . Hypertension Mother   . Diabetes Mother   . Gout Mother    Allergies  Allergen Reactions  . Shellfish Allergy Anaphylaxis     Previous Medications   CLINDAMYCIN (CLEOCIN) 300 MG CAPSULE    Take 1 capsule (300 mg total) by mouth 4 (four) times daily.   LISINOPRIL (PRINIVIL,ZESTRIL) 5 MG TABLET    TAKE 1 TABLET (5 MG TOTAL) BY MOUTH DAILY.   NAPROXEN SODIUM (ALEVE) 220 MG TABLET    Take by mouth.   RANITIDINE (ZANTAC) 150 MG TABLET    Take by mouth.    Review of Systems  Constitutional: Negative.   Respiratory: Negative.   Cardiovascular: Negative.   Skin:       Forehead laceration     Social History  Substance Use Topics  . Smoking status: Never Smoker  . Smokeless tobacco: Never Used  . Alcohol use No   Objective:   BP (!) 152/88 (BP Location: Right Arm, Patient Position:  Sitting, Cuff Size: Normal)   Pulse 86   Temp 98.2 F (36.8 C) (Oral)   Wt 170 lb (77.1 kg)   SpO2 98%   BMI 28.29 kg/m   Physical Exam  Constitutional: She is oriented to person, place, and time. She appears well-developed and well-nourished. No distress.  HENT:  Head: Normocephalic.  Right Ear: Hearing normal.  Left Ear: Hearing normal.  Nose: Nose normal.  Large 5x1 cm flap laceration with surrounding excoriations on the left forehead at the hairline. Hematoma and swelling around the left cheek and orbit is slowly resolving.  Eyes: Conjunctivae and lids are normal. Right eye exhibits no discharge. Left eye exhibits no discharge. No scleral icterus.  Neck: Neck supple.  Cardiovascular: Normal rate and regular rhythm.   Pulmonary/Chest: Effort normal. No respiratory distress.  Musculoskeletal: Normal range of motion.  Lymphadenopathy:    She has no cervical adenopathy.  Neurological: She is alert and oriented to person, place, and time. She has normal reflexes.  Skin: Skin is intact. No lesion and no rash noted.     Psychiatric: She has a normal mood and affect. Her speech is normal and behavior is normal. Thought content normal.      Assessment & Plan:     1. Laceration of scalp, subsequent encounter 5  x 1 cm laceration of the left forehead at the hairline is healing well with subcutaneous hematoma and tenderness. 11 sutures placed in the ER on 05-22-16 after she fell at home pushing a lawnmower up a ramp onto her truck. No LOC. Last tetanus was given at work a couple years ago (will get [place] a copy of the record). Was given Clindamycin for infection prevention from embedded dirt. No sign of infection today. Finish the Clindamycin and all sutures were removed. Should protect wound with a bandana and may gently shampoo hair in 2 days. May return to work 05-31-16. Recheck prn.

## 2016-09-08 ENCOUNTER — Other Ambulatory Visit: Payer: Self-pay | Admitting: Family Medicine

## 2016-09-08 DIAGNOSIS — I10 Essential (primary) hypertension: Secondary | ICD-10-CM

## 2016-09-08 NOTE — Telephone Encounter (Signed)
Refill request for Lisinopril 5 Mg .

## 2016-10-20 ENCOUNTER — Encounter: Payer: Self-pay | Admitting: Family Medicine

## 2017-01-06 ENCOUNTER — Other Ambulatory Visit: Payer: Self-pay | Admitting: Family Medicine

## 2017-01-06 DIAGNOSIS — I10 Essential (primary) hypertension: Secondary | ICD-10-CM

## 2017-05-02 ENCOUNTER — Other Ambulatory Visit: Payer: Self-pay | Admitting: Family Medicine

## 2017-05-02 DIAGNOSIS — I10 Essential (primary) hypertension: Secondary | ICD-10-CM

## 2017-06-27 ENCOUNTER — Other Ambulatory Visit: Payer: Self-pay | Admitting: Family Medicine

## 2017-06-27 DIAGNOSIS — I10 Essential (primary) hypertension: Secondary | ICD-10-CM

## 2017-07-28 ENCOUNTER — Other Ambulatory Visit: Payer: Self-pay | Admitting: Family Medicine

## 2017-07-28 DIAGNOSIS — I10 Essential (primary) hypertension: Secondary | ICD-10-CM

## 2017-08-01 ENCOUNTER — Other Ambulatory Visit: Payer: Self-pay | Admitting: Family Medicine

## 2017-08-01 DIAGNOSIS — I10 Essential (primary) hypertension: Secondary | ICD-10-CM

## 2017-08-01 MED ORDER — LISINOPRIL 5 MG PO TABS
ORAL_TABLET | ORAL | 3 refills | Status: DC
Start: 2017-08-01 — End: 2017-10-04

## 2017-08-01 NOTE — Telephone Encounter (Signed)
Pt contacted office for refill request on the following medications:  lisinopril (PRINIVIL,ZESTRIL) 5 MG tablet   [facility] [address]  Last Rx: 06/28/17 LOV: 05/27/16 NOV: 09/02/17 Pt stated that she is about to have eye surgery and can't come for OV until August. Pt is requesting enough medication to last until 09/02/17. Please advise. Thanks TNP

## 2017-09-02 ENCOUNTER — Ambulatory Visit: Payer: 59 | Admitting: Family Medicine

## 2017-09-12 ENCOUNTER — Ambulatory Visit: Payer: 59 | Admitting: Family Medicine

## 2017-10-04 ENCOUNTER — Encounter: Payer: Self-pay | Admitting: Family Medicine

## 2017-10-04 ENCOUNTER — Ambulatory Visit: Payer: 59 | Admitting: Family Medicine

## 2017-10-04 VITALS — BP 146/88 | HR 80 | Temp 98.2°F | Wt 172.0 lb

## 2017-10-04 DIAGNOSIS — R0789 Other chest pain: Secondary | ICD-10-CM

## 2017-10-04 DIAGNOSIS — I10 Essential (primary) hypertension: Secondary | ICD-10-CM | POA: Diagnosis not present

## 2017-10-04 MED ORDER — LISINOPRIL 10 MG PO TABS: 10.0000 mg | ORAL_TABLET | Freq: Every day | ORAL | 3 refills | Status: DC

## 2017-10-04 NOTE — Progress Notes (Signed)
[name] Female    DOB: 1955-04-29   62 y.o.   MRN: [id number] Visit Date: 10/04/2017  Today's Provider: Vernie Murders, PA   Chief Complaint  Patient presents with  . Hypertension   Subjective:    Hypertension  This is a chronic problem. The problem is unchanged. The problem is controlled (Pt reports her BP runs about 140's/85). Pertinent negatives include no anxiety, blurred vision, chest pain, headaches, malaise/fatigue, neck pain, orthopnea, palpitations, peripheral edema, PND, shortness of breath or sweats. There are no associated agents to hypertension. Past treatments include ACE inhibitors. There are no compliance problems.    BP Readings from Last 3 Encounters:  10/04/17 (!) 146/88  05/27/16 (!) 152/88  05/22/16 135/84   Patient Active Problem List   Diagnosis Date Noted  . Chest pressure 05/19/2015  . Essential (primary) hypertension 05/19/2015      Past Surgical History:  Procedure Laterality Date  . ABDOMINAL HYSTERECTOMY    . ANKLE SURGERY    . APPENDECTOMY    . ELBOW SURGERY Left   . TUMOR REMOVAL     abdomen   Family History  Problem Relation Age of Onset  . Heart failure Father   . Ovarian cancer Maternal Grandmother   . Cirrhosis Paternal Grandfather   . Hypertension Mother   . Diabetes Mother   . Gout Mother    Allergies  Allergen Reactions  . Shellfish Allergy Anaphylaxis    Current Outpatient Medications:  .  lisinopril (PRINIVIL,ZESTRIL) 5 MG tablet, TAKE 1 TABLET BY MOUTH EVERY DAY. **PATIENT NEEDS APPT AND LABS*, Disp: 30 tablet, Rfl: 3 .  naproxen sodium (ALEVE) 220 MG tablet, Take by mouth., Disp: , Rfl:  .  ranitidine (ZANTAC) 150 MG tablet, Take by mouth., Disp: , Rfl:   Review of Systems  Constitutional: Negative for malaise/fatigue.  Eyes: Negative for blurred vision.  Respiratory: Negative for shortness of breath.   Cardiovascular: Negative for chest pain, palpitations, orthopnea and PND.    Musculoskeletal: Negative for neck pain.  Neurological: Negative for headaches.   Social History   Tobacco Use  . Smoking status: Never Smoker  . Smokeless tobacco: Never Used  Substance Use Topics  . Alcohol use: No    Alcohol/week: 0.0 standard drinks   Objective:   BP (!) 146/88 (BP Location: Right Arm, Patient Position: Sitting, Cuff Size: Normal)   Pulse 80   Temp 98.2 F (36.8 C) (Oral)   Wt 172 lb (78 kg)   SpO2 97%   BMI 28.62 kg/m  Vitals:   10/04/17 1509  BP: (!) 146/88  Pulse: 80  Temp: 98.2 F (36.8 C)  TempSrc: Oral  SpO2: 97%  Weight: 172 lb (78 kg)   Physical Exam  Constitutional: She is oriented to person, place, and time. She appears well-developed and well-nourished. No distress.  HENT:  Head: Normocephalic and atraumatic.  Right Ear: Hearing normal.  Left Ear: Hearing normal.  Nose: Nose normal.  Eyes: Conjunctivae and lids are normal. Right eye exhibits no discharge. Left eye exhibits no discharge. No scleral icterus.  Neck: Neck supple. No JVD present.  Cardiovascular: Normal rate, regular rhythm and normal heart sounds.  Pulmonary/Chest: Effort normal and breath sounds normal. No respiratory distress.  Abdominal: Soft. Bowel sounds are normal.  Musculoskeletal: Normal range of motion.  Neurological: She is alert and oriented to person, place, and time.  Skin: Skin is intact. No lesion and no rash noted.  Psychiatric: She has  a normal mood and affect. Her speech is normal and behavior is normal. Thought content normal.      Assessment & Plan:     1. Chest discomfort Intermittently will have a substernal chest ache/discomfort after lifting or exerting herself significantly. No nausea, radiation to arms or neck. Some slight shortness of breath for only a few seconds. No diaphoresis. EKG normal. Will get routine labs and follow up pending reports. - EKG 12-Lead - CBC with Differential/Platelet - Comprehensive metabolic panel - Lipid panel -  TSH  2. Essential (primary) hypertension Tolerating the Lisinopril without cough or angioedema. BP still elevated today. Will check routine labs and increase Lisinopril from 5 mg to 10 mg qd. Recheck pending lab reports. - CBC with Differential/Platelet - Comprehensive metabolic panel - Lipid panel - TSH - lisinopril (PRINIVIL,ZESTRIL) 10 MG tablet; Take 1 tablet (10 mg total) by mouth daily.  Dispense: 90 tablet; Refill: [place], [facility]

## 2017-10-12 DIAGNOSIS — I10 Essential (primary) hypertension: Secondary | ICD-10-CM | POA: Diagnosis not present

## 2017-10-12 DIAGNOSIS — R0789 Other chest pain: Secondary | ICD-10-CM | POA: Diagnosis not present

## 2017-10-13 ENCOUNTER — Telehealth: Payer: Self-pay

## 2017-10-13 LAB — LIPID PANEL
CHOLESTEROL TOTAL: 175 mg/dL (ref 100–199)
Chol/HDL Ratio: 3.2 ratio (ref 0.0–4.4)
HDL: 54 mg/dL (ref >39)
LDL CALC: 110 mg/dL — AB (ref 0–99)
Triglycerides: 57 mg/dL (ref 0–149)
VLDL CHOLESTEROL CAL: 11 mg/dL (ref 5–40)

## 2017-10-13 LAB — COMPREHENSIVE METABOLIC PANEL
ALBUMIN: 3.7 g/dL (ref 3.5–5.5)
ALT: 13 IU/L (ref 0–32)
AST: 11 IU/L (ref 0–40)
Albumin/Globulin Ratio: 1.6 (ref 1.2–2.2)
Alkaline Phosphatase: 83 IU/L (ref 39–117)
BUN / CREAT RATIO: 20 (ref 9–23)
BUN: 14 mg/dL (ref 6–24)
Bilirubin Total: 0.4 mg/dL (ref 0.0–1.2)
CO2: 20 mmol/L (ref 20–29)
CREATININE: 0.71 mg/dL (ref 0.57–1.00)
Calcium: 8.9 mg/dL (ref 8.7–10.2)
Chloride: 106 mmol/L (ref 96–106)
GFR calc non Af Amer: 94 mL/min/{1.73_m2} (ref >59)
GFR, EST AFRICAN AMERICAN: 108 mL/min/{1.73_m2} (ref >59)
GLUCOSE: 83 mg/dL (ref 65–99)
Globulin, Total: 2.3 g/dL (ref 1.5–4.5)
Potassium: 4.4 mmol/L (ref 3.5–5.2)
Sodium: 137 mmol/L (ref 134–144)
Total Protein: 6 g/dL (ref 6.0–8.5)

## 2017-10-13 LAB — CBC WITH DIFFERENTIAL/PLATELET
Basophils Absolute: 0 10*3/uL (ref 0.0–0.2)
Basos: 1 %
EOS (ABSOLUTE): 0.2 10*3/uL (ref 0.0–0.4)
Eos: 4 %
Hematocrit: 33.4 % — ABNORMAL LOW (ref 34.0–46.6)
Hemoglobin: 11.3 g/dL (ref 11.1–15.9)
Immature Grans (Abs): 0 10*3/uL (ref 0.0–0.1)
Immature Granulocytes: 0 %
Lymphocytes Absolute: 1.5 10*3/uL (ref 0.7–3.1)
Lymphs: 33 %
MCH: 29.6 pg (ref 26.6–33.0)
MCHC: 33.8 g/dL (ref 31.5–35.7)
MCV: 87 fL (ref 79–97)
Monocytes Absolute: 0.3 10*3/uL (ref 0.1–0.9)
Monocytes: 7 %
Neutrophils Absolute: 2.5 10*3/uL (ref 1.4–7.0)
Neutrophils: 55 %
Platelets: 347 10*3/uL (ref 150–450)
RBC: 3.82 x10E6/uL (ref 3.77–5.28)
RDW: 12.5 % (ref 12.3–15.4)
WBC: 4.6 10*3/uL (ref 3.4–10.8)

## 2017-10-13 LAB — TSH: TSH: 0.92 u[IU]/mL (ref 0.450–4.500)

## 2017-10-13 NOTE — Telephone Encounter (Signed)
-----   Message from Margo Common, [place] sent at 10/13/2017  9:14 AM EDT ----- All blood tests normal except LDL above goal of <100. May use Co-Q 10 100 mg qd with Red Yeast Rice 600 mg qd. If chest discomfort recurs, should consider evaluation by cardiologist for possible stress test. Recheck labs and BP in 3 months.

## 2017-10-13 NOTE — Telephone Encounter (Signed)
LMTCB 10/13/2017  Thanks,   -Mickel Baas

## 2017-10-17 NOTE — Telephone Encounter (Signed)
Patient advised as below. Patient verbalizes understanding and is in agreement with treatment plan.  

## 2018-01-16 ENCOUNTER — Ambulatory Visit: Payer: Self-pay | Admitting: Family Medicine

## 2018-02-06 ENCOUNTER — Ambulatory Visit: Payer: Self-pay | Admitting: Family Medicine

## 2018-02-17 NOTE — Progress Notes (Signed)
[name] Female    DOB: 10-25-1955   63 y.o.   MRN: [id number] Visit Date: 02/20/2018  Today's Provider: Vernie Murders, PA   Chief Complaint  Patient presents with  . Follow-up    Hypertension and Cholesterol   Subjective:     HPI Patient here to follow-up Hypertension and Cholesterol. Patient c/o of chest discomfort for the past few weeks. Reports is a sharp pain that comes 2-3 times a week maximum.  Hypertension, follow-up:  BP Readings from Last 3 Encounters:  02/20/18 (!) 150/80  10/04/17 (!) 146/88  05/27/16 (!) 152/88    She was last seen for hypertension 4 months ago.  BP at that visit was 146/88. Management since that visit includes increased Lisinopril from 5 mg to 10 mg qd She reports excellent compliance with treatment. She is not having side effects.  She is exercising. She is adherent to low salt diet.   Outside blood pressures are 120's/80's She is experiencing none.  Patient denies chest pain, chest pressure/discomfort, dyspnea, fatigue, irregular heart beat, lower extremity edema, near-syncope and palpitations.     Weight trend: stable Wt Readings from Last 3 Encounters:  02/20/18 169 lb 9.6 oz (76.9 kg)  10/04/17 172 lb (78 kg)  05/27/16 170 lb (77.1 kg)    Current diet: well balanced  ------------------------------------------------------------------------  Lipid/Cholesterol, Follow-up:   Last seen for this months ago.  Management changes since that visit include May use Co-Q 10 100 mg qd with Red Yeast Rice 600 mg qd. Reports that she takes it when she remembers. . Last Lipid Panel:    Component Value Date/Time   CHOL 175 10/12/2017 0948   TRIG 57 10/12/2017 0948   HDL 54 10/12/2017 0948   CHOLHDL 3.2 10/12/2017 0948   LDLCALC 110 (H) 10/12/2017 0948    Risk factors for vascular disease include hypercholesterolemia and hypertension  She reports excellent compliance with treatment. She is not having side  effects.  Current symptoms include none and have been stable. -------------------------------------------------------------------  Past Medical History:  Diagnosis Date  . Hypertension    Past Surgical History:  Procedure Laterality Date  . ABDOMINAL HYSTERECTOMY    . ANKLE SURGERY    . APPENDECTOMY    . ELBOW SURGERY Left   . TUMOR REMOVAL     abdomen   Family History  Problem Relation Age of Onset  . Heart failure Father   . Ovarian cancer Maternal Grandmother   . Cirrhosis Paternal Grandfather   . Hypertension Mother   . Diabetes Mother   . Gout Mother    Allergies  Allergen Reactions  . Shellfish Allergy Anaphylaxis    Current Outpatient Medications:  .  lisinopril (PRINIVIL,ZESTRIL) 10 MG tablet, Take 1 tablet (10 mg total) by mouth daily., Disp: 90 tablet, Rfl: 3 .  naproxen sodium (ALEVE) 220 MG tablet, Take by mouth., Disp: , Rfl:   Review of Systems  Constitutional: Negative.   HENT: Negative.   Respiratory: Negative.   Cardiovascular: Negative.   Gastrointestinal: Negative.   Genitourinary: Negative.   Musculoskeletal: Negative.   Neurological: Negative.    Social History   Tobacco Use  . Smoking status: Never Smoker  . Smokeless tobacco: Never Used  Substance Use Topics  . Alcohol use: No    Alcohol/week: 0.0 standard drinks     Objective:   BP (!) 150/80 (BP Location: Right Arm, Patient Position: Sitting, Cuff Size: Large)   Pulse 88   Temp  97.6 F (36.4 C) (Oral)   Resp 16   Wt 169 lb 9.6 oz (76.9 kg)   BMI 28.22 kg/m  Vitals:   02/20/18 1431  BP: (!) 150/80  Pulse: 88  Resp: 16  Temp: 97.6 F (36.4 C)  TempSrc: Oral  Weight: 169 lb 9.6 oz (76.9 kg)   Physical Exam Constitutional:      General: She is not in acute distress.    Appearance: She is well-developed.  HENT:     Head: Normocephalic and atraumatic.     Right Ear: Hearing normal.     Left Ear: Hearing normal.     Nose: Nose normal.  Eyes:     General: Lids are  normal. No scleral icterus.       Right eye: No discharge.        Left eye: No discharge.     Conjunctiva/sclera: Conjunctivae normal.  Neck:     Musculoskeletal: Normal range of motion and neck supple.  Cardiovascular:     Rate and Rhythm: Normal rate and regular rhythm.     Heart sounds: Normal heart sounds.  Pulmonary:     Effort: Pulmonary effort is normal. No respiratory distress.  Musculoskeletal: Normal range of motion.  Skin:    Findings: No lesion or rash.  Neurological:     Mental Status: She is alert and oriented to person, place, and time.  Psychiatric:        Speech: Speech normal.        Behavior: Behavior normal.        Thought Content: Thought content normal.       Assessment & Plan    1. Essential (primary) hypertension Some slight anxiety when she comes to the office for BP check. BP in the 130/80 range at home. No chest pains, palpitations, peripheral edema or dyspnea. Continues to take the Lisinopril 10 mg qd without hives or angioedema. Check follow up labs and continue to monitor BP at home. Follow up pending reports. - CBC with Differential/Platelet - Comprehensive metabolic panel - Lipid panel - TSH  2. Elevated LDL cholesterol level Trying to follow a low fat salt restricted diet and exercise regularly (daily activities outdoors are very physical). Recheck labs and may try Red Yeast Rice with Co-Q 10 and Krill Oil daily.  Lipid Panel     Component Value Date/Time   CHOL 175 10/12/2017 0948   TRIG 57 10/12/2017 0948   HDL 54 10/12/2017 0948   CHOLHDL 3.2 10/12/2017 0948   LDLCALC 110 (H) 10/12/2017 0948    - Comprehensive metabolic panel - Lipid panel - TSH     Vernie Murders, PA  [facility]

## 2018-02-20 ENCOUNTER — Encounter: Payer: Self-pay | Admitting: Family Medicine

## 2018-02-20 ENCOUNTER — Ambulatory Visit: Payer: 59 | Admitting: Family Medicine

## 2018-02-20 VITALS — BP 150/80 | HR 88 | Temp 97.6°F | Resp 16 | Wt 169.6 lb

## 2018-02-20 DIAGNOSIS — I10 Essential (primary) hypertension: Secondary | ICD-10-CM

## 2018-02-20 DIAGNOSIS — E7800 Pure hypercholesterolemia, unspecified: Secondary | ICD-10-CM

## 2018-03-22 DIAGNOSIS — E7800 Pure hypercholesterolemia, unspecified: Secondary | ICD-10-CM | POA: Diagnosis not present

## 2018-03-22 DIAGNOSIS — I10 Essential (primary) hypertension: Secondary | ICD-10-CM | POA: Diagnosis not present

## 2018-03-23 ENCOUNTER — Telehealth: Payer: Self-pay | Admitting: *Deleted

## 2018-03-23 LAB — COMPREHENSIVE METABOLIC PANEL
ALT: 10 IU/L (ref 0–32)
AST: 14 IU/L (ref 0–40)
Albumin/Globulin Ratio: 1.7 (ref 1.2–2.2)
Albumin: 4.2 g/dL (ref 3.8–4.9)
Alkaline Phosphatase: 93 IU/L (ref 39–117)
BUN / CREAT RATIO: 18 (ref 12–28)
BUN: 13 mg/dL (ref 8–27)
Bilirubin Total: 0.4 mg/dL (ref 0.0–1.2)
CALCIUM: 9.3 mg/dL (ref 8.7–10.3)
CO2: 21 mmol/L (ref 20–29)
CREATININE: 0.72 mg/dL (ref 0.57–1.00)
Chloride: 100 mmol/L (ref 96–106)
GFR calc Af Amer: 105 mL/min/{1.73_m2} (ref >59)
GFR, EST NON AFRICAN AMERICAN: 91 mL/min/{1.73_m2} (ref >59)
GLOBULIN, TOTAL: 2.5 g/dL (ref 1.5–4.5)
GLUCOSE: 79 mg/dL (ref 65–99)
Potassium: 4.9 mmol/L (ref 3.5–5.2)
SODIUM: 137 mmol/L (ref 134–144)
Total Protein: 6.7 g/dL (ref 6.0–8.5)

## 2018-03-23 LAB — LIPID PANEL
CHOL/HDL RATIO: 3 ratio (ref 0.0–4.4)
Cholesterol, Total: 181 mg/dL (ref 100–199)
HDL: 60 mg/dL (ref >39)
LDL CALC: 111 mg/dL — AB (ref 0–99)
TRIGLYCERIDES: 52 mg/dL (ref 0–149)
VLDL Cholesterol Cal: 10 mg/dL (ref 5–40)

## 2018-03-23 LAB — CBC WITH DIFFERENTIAL/PLATELET
Basophils Absolute: 0 10*3/uL (ref 0.0–0.2)
Basos: 1 %
EOS (ABSOLUTE): 0.1 10*3/uL (ref 0.0–0.4)
EOS: 3 %
HEMATOCRIT: 35.4 % (ref 34.0–46.6)
Hemoglobin: 12.2 g/dL (ref 11.1–15.9)
IMMATURE GRANS (ABS): 0 10*3/uL (ref 0.0–0.1)
Immature Granulocytes: 0 %
LYMPHS: 34 %
Lymphocytes Absolute: 1.6 10*3/uL (ref 0.7–3.1)
MCH: 29.9 pg (ref 26.6–33.0)
MCHC: 34.5 g/dL (ref 31.5–35.7)
MCV: 87 fL (ref 79–97)
MONOCYTES: 9 %
Monocytes Absolute: 0.4 10*3/uL (ref 0.1–0.9)
Neutrophils Absolute: 2.4 10*3/uL (ref 1.4–7.0)
Neutrophils: 53 %
Platelets: 352 10*3/uL (ref 150–450)
RBC: 4.08 x10E6/uL (ref 3.77–5.28)
RDW: 12.9 % (ref 11.7–15.4)
WBC: 4.6 10*3/uL (ref 3.4–10.8)

## 2018-03-23 LAB — TSH: TSH: 1.07 u[IU]/mL (ref 0.450–4.500)

## 2018-03-23 NOTE — Telephone Encounter (Signed)
-----   Message from Margo Common, [place] sent at 03/23/2018  9:54 AM EST ----- All blood tests are normal with LDL cholesterol not showing any change yet. Risk ration still very good at 3.0. Continue Red Yeast Rice with Co-Q 10 daily and recheck progress in 3 months.

## 2018-03-23 NOTE — Telephone Encounter (Signed)
LMOVM for pt to return call 

## 2018-03-27 NOTE — Telephone Encounter (Signed)
LMOVM for pt to return call 

## 2018-03-29 NOTE — Telephone Encounter (Signed)
LMOVM for pt to return call 

## 2018-03-30 NOTE — Telephone Encounter (Signed)
Pt returned call ° °Thanks  teri °

## 2018-03-31 NOTE — Telephone Encounter (Signed)
LMOVM for pt to return call 

## 2018-04-03 ENCOUNTER — Telehealth: Payer: Self-pay | Admitting: Family Medicine

## 2018-04-03 NOTE — Telephone Encounter (Signed)
Pt returned missed call. °Please call pt back, ° °Thanks, °TGH °

## 2018-04-05 NOTE — Telephone Encounter (Signed)
See other phone note with results.

## 2018-04-05 NOTE — Telephone Encounter (Signed)
LMOVM for pt to return call 

## 2018-04-14 NOTE — Telephone Encounter (Signed)
Unable to reach pt again. Patient did not return last call. Will encounter for now.

## 2018-08-03 ENCOUNTER — Encounter: Payer: Self-pay | Admitting: Family Medicine

## 2018-08-03 ENCOUNTER — Other Ambulatory Visit: Payer: Self-pay

## 2018-08-03 ENCOUNTER — Ambulatory Visit (INDEPENDENT_AMBULATORY_CARE_PROVIDER_SITE_OTHER): Payer: 59 | Admitting: Family Medicine

## 2018-08-03 VITALS — BP 144/82 | HR 76 | Temp 98.6°F | Wt 172.0 lb

## 2018-08-03 DIAGNOSIS — I10 Essential (primary) hypertension: Secondary | ICD-10-CM | POA: Diagnosis not present

## 2018-08-03 MED ORDER — LISINOPRIL 10 MG PO TABS: 15.0000 mg | ORAL_TABLET | Freq: Every day | ORAL | 3 refills | Status: DC

## 2018-08-03 NOTE — Progress Notes (Signed)
Karen Boyd  MRN: [id number] DOB: Jan 18, 1956  Subjective:  HPI   The patient is a 63 year old female who presents for evaluation of her blood pressure.  She is scheduled to have eye surgery later in the month.  While doing her pre-op work up yesterday her blood pressure was elevated and was advised to be seen by her PCP.  Patient Active Problem List   Diagnosis Date Noted  . Chest pressure 05/19/2015  . Essential (primary) hypertension 05/19/2015    Past Medical History:  Diagnosis Date  . Hypertension     Social History   Socioeconomic History  . Marital status: Single    Spouse name: Not on file  . Number of children: Not on file  . Years of education: Not on file  . Highest education level: Not on file  Occupational History  . Not on file  Social Needs  . Financial resource strain: Not on file  . Food insecurity    Worry: Not on file    Inability: Not on file  . Transportation needs    Medical: Not on file    Non-medical: Not on file  Tobacco Use  . Smoking status: Never Smoker  . Smokeless tobacco: Never Used  Substance and Sexual Activity  . Alcohol use: No    Alcohol/week: 0.0 standard drinks  . Drug use: No  . Sexual activity: Not on file  Lifestyle  . Physical activity    Days per week: Not on file    Minutes per session: Not on file  . Stress: Not on file  Relationships  . Social Herbalist on phone: Not on file    Gets together: Not on file    Attends religious service: Not on file    Active member of club or organization: Not on file    Attends meetings of clubs or organizations: Not on file    Relationship status: Not on file  . Intimate partner violence    Fear of current or ex partner: Not on file    Emotionally abused: Not on file    Physically abused: Not on file    Forced sexual activity: Not on file  Other Topics Concern  . Not on file  Social History Narrative  . Not on file    Outpatient Encounter  Medications as of 08/03/2018  Medication Sig Note  . lisinopril (PRINIVIL,ZESTRIL) 10 MG tablet Take 1 tablet (10 mg total) by mouth daily.   . naproxen sodium (ALEVE) 220 MG tablet Take by mouth. 05/19/2015: Medication taken as needed.  Received from: [facility]   No facility-administered encounter medications on file as of 08/03/2018.     Allergies  Allergen Reactions  . Shellfish Allergy Anaphylaxis    Review of Systems  Constitutional: Negative for fever.  Eyes: Positive for pain (right eyechronic and unchanged). Negative for blurred vision, double vision, photophobia, discharge and redness.  Respiratory: Positive for cough (sinus drainage). Negative for shortness of breath and wheezing.   Cardiovascular: Negative for chest pain and leg swelling.  Neurological: Negative for dizziness and headaches.    Objective:  BP (!) 144/82 (BP Location: Right Arm, Patient Position: Sitting, Cuff Size: Normal)   Pulse 76   Temp 98.6 F (37 C) (Oral)   Wt 172 lb (78 kg)   SpO2 99%   BMI 28.62 kg/m    Wt Readings from Last 3 Encounters:  08/03/18 172 lb (78 kg)  02/20/18 169 lb  9.6 oz (76.9 kg)  10/04/17 172 lb (78 kg)   Physical Exam  Constitutional: She is oriented to person, place, and time and well-developed, well-nourished, and in no distress.  HENT:  Head: Normocephalic.  Eyes: Conjunctivae are normal.  Neck: Neck supple.  Cardiovascular: Normal rate and regular rhythm.  Pulmonary/Chest: Effort normal and breath sounds normal.  Abdominal: Soft.  Musculoskeletal: Normal range of motion.  Neurological: She is alert and oriented to person, place, and time.  Skin: No rash noted.  Psychiatric: Mood, affect and judgment normal.    Assessment and Plan :   1. Essential (primary) hypertension While at the ophthalmologist preparing for cataract and iridotomy procedures, they found her BP was very high (over 180/100). She decided to take an extra 1/2 tablet of her  Lisinopril last night and wanted to recheck BP here today. Improved control on the 15 mg of Lisinopril, restricting sodium, caffeine, ETOH and decongestant use. Will continue at this dosage and asked her to call in report of BP readings in 10-14 days. May need recheck of labs at that time. Denies chest pains, palpitations, dyspnea or edema. - lisinopril (ZESTRIL) 10 MG tablet; Take 1.5 tablets (15 mg total) by mouth daily.  Dispense: 90 tablet; Refill: 3

## 2018-08-11 ENCOUNTER — Telehealth: Payer: Self-pay

## 2018-08-11 DIAGNOSIS — Z20822 Contact with and (suspected) exposure to covid-19: Secondary | ICD-10-CM

## 2018-08-11 NOTE — Telephone Encounter (Signed)
Patient reports that she had a [facility] through [facility] visit just a few minutes ago. Reports that she has fever, body ache and they advised for her to be tested for COVID and she is requesting if her provider can put the referral for her.She work at the [employer] and reports that she has been exposed.

## 2018-08-11 NOTE — Telephone Encounter (Signed)
Please order covid test. Thanks!

## 2018-08-11 NOTE — Telephone Encounter (Signed)
Place order for COVID-19 test and set up home monitoring through [facility] (with temperature monitoring) for suspected COVID infection. Advise patient to do the monitoring through MyChart and isolate at home until test results are available and until she is free of fever for 72 hours without taking Tylenol or Advil. Increase fluid intake, rest, may use Tylenol or Advil for fever or pains and wash hands frequently.

## 2018-08-11 NOTE — Telephone Encounter (Signed)
Contacted pt and she has been scheduled at the [facility] building for 08/14/18 at 8:15. Pt is aware to remain in her car and to wear a mask. Pt is aware result could take up to 3-7 days to return. Pt's mychart is active. Pt understood and had no additional questions at this time. Nothing further is needed  Test was ordered.

## 2018-08-11 NOTE — Telephone Encounter (Signed)
Please review. Thanks!  

## 2018-08-14 ENCOUNTER — Other Ambulatory Visit: Payer: 59

## 2018-08-14 DIAGNOSIS — Z20822 Contact with and (suspected) exposure to covid-19: Secondary | ICD-10-CM

## 2018-08-18 LAB — NOVEL CORONAVIRUS, NAA: SARS-CoV-2, NAA: NOT DETECTED

## 2018-08-21 ENCOUNTER — Telehealth: Payer: Self-pay

## 2018-08-21 NOTE — Telephone Encounter (Signed)
-----   Message from Margo Common, [place] sent at 08/18/2018  5:49 PM EDT ----- COVID-19 test is negative. Should continue to follow the pandemic restrictions of frequent hand washing, wearing a mask in public and social distancing. If any respiratory symptoms or fever develops, may have to retest.

## 2018-08-21 NOTE — Telephone Encounter (Signed)
Patient has been advised. KW 

## 2018-09-14 ENCOUNTER — Other Ambulatory Visit: Payer: Self-pay | Admitting: Nurse Practitioner

## 2018-09-14 ENCOUNTER — Ambulatory Visit
Admission: RE | Admit: 2018-09-14 | Discharge: 2018-09-14 | Disposition: A | Discharge status: Home / Self Care | Payer: No Typology Code available for payment source | Source: Ambulatory Visit | Attending: Nurse Practitioner | Admitting: Nurse Practitioner

## 2018-09-14 DIAGNOSIS — R609 Edema, unspecified: Secondary | ICD-10-CM

## 2018-09-14 DIAGNOSIS — R52 Pain, unspecified: Secondary | ICD-10-CM

## 2018-10-02 ENCOUNTER — Other Ambulatory Visit: Payer: Self-pay | Admitting: Family Medicine

## 2018-10-02 DIAGNOSIS — I10 Essential (primary) hypertension: Secondary | ICD-10-CM

## 2018-10-16 ENCOUNTER — Other Ambulatory Visit: Payer: Self-pay

## 2018-10-16 ENCOUNTER — Ambulatory Visit (INDEPENDENT_AMBULATORY_CARE_PROVIDER_SITE_OTHER): Payer: 59 | Admitting: Family Medicine

## 2018-10-16 ENCOUNTER — Encounter: Payer: Self-pay | Admitting: Family Medicine

## 2018-10-16 VITALS — BP 142/90 | HR 70 | Temp 97.3°F | Wt 173.0 lb

## 2018-10-16 DIAGNOSIS — D489 Neoplasm of uncertain behavior, unspecified: Secondary | ICD-10-CM

## 2018-10-16 NOTE — Progress Notes (Signed)
Karen Boyd  MRN: [id number] DOB: 05/29/1955  Subjective:  HPI   The patient is a 63 year old female who p[resents to day for evaluation of a lesion that is located on her right upper, inner thigh.  She noticed it about 1 month ago.  She said at first she thought it was a freckle and then it got dry, itchy and now is just an irregular looking lesion. It at no time had any sign of infection or drainage.   The patient has family history of melanoma and would like to have this checked possibly by dermatology due to the history.  Patient Active Problem List   Diagnosis Date Noted   Chest pressure 05/19/2015   Essential (primary) hypertension 05/19/2015   Past Medical History:  Diagnosis Date   Hypertension    Social History   Socioeconomic History   Marital status: Single    Spouse name: Not on file   Number of children: Not on file   Years of education: Not on file   Highest education level: Not on file  Occupational History   Not on file  Social Needs   Financial resource strain: Not on file   Food insecurity    Worry: Not on file    Inability: Not on file   Transportation needs    Medical: Not on file    Non-medical: Not on file  Tobacco Use   Smoking status: Never Smoker   Smokeless tobacco: Never Used  Substance and Sexual Activity   Alcohol use: No    Alcohol/week: 0.0 standard drinks   Drug use: No   Sexual activity: Not on file  Lifestyle   Physical activity    Days per week: Not on file    Minutes per session: Not on file   Stress: Not on file  Relationships   Social connections    Talks on phone: Not on file    Gets together: Not on file    Attends religious service: Not on file    Active member of club or organization: Not on file    Attends meetings of clubs or organizations: Not on file    Relationship status: Not on file   Intimate partner violence    Fear of current or ex partner: Not on file    Emotionally abused:  Not on file    Physically abused: Not on file    Forced sexual activity: Not on file  Other Topics Concern   Not on file  Social History Narrative   Not on file   Outpatient Encounter Medications as of 10/16/2018  Medication Sig Note   lisinopril (ZESTRIL) 10 MG tablet TAKE 1 TABLET BY MOUTH EVERY DAY    naproxen sodium (ALEVE) 220 MG tablet Take by mouth. 05/19/2015: Medication taken as needed.  Received from: [facility]   No facility-administered encounter medications on file as of 10/16/2018.    Allergies  Allergen Reactions   Shellfish Allergy Anaphylaxis   Review of Systems  Constitutional: Negative for fever.  HENT: Negative for ear pain, sinus pain and sore throat.   Respiratory: Negative for cough and wheezing.   Cardiovascular: Negative for chest pain and palpitations.  Skin: Positive for itching (at site of lesion but only in the beginning).    Objective:  BP (!) 142/90 (BP Location: Right Arm, Patient Position: Sitting, Cuff Size: Normal)    Pulse 70    Temp (!) 97.3 F (36.3 C) (Oral)    Wt  173 lb (78.5 kg)    SpO2 99%    BMI 28.79 kg/m   Physical Exam  Constitutional: She is oriented to person, place, and time and well-developed, well-nourished, and in no distress.  HENT:  Head: Normocephalic.  Eyes: Conjunctivae are normal.  Neck: Neck supple.  Pulmonary/Chest: Effort normal.  Abdominal: Soft.  Musculoskeletal: Normal range of motion.  Neurological: She is alert and oriented to person, place, and time.  Skin: No rash noted.  6 mm tan to red lesion on the right thigh. No tenderness or other lesions noted.  Psychiatric: Mood, affect and judgment normal.    Assessment and Plan :   1. Neoplasm, uncertain whether benign or malignant Lesion on the right thigh for the past month with changing colors and depressing. Has a family history of melanoma. Will schedule dermatology referral. - Ambulatory referral to Dermatology

## 2019-04-12 ENCOUNTER — Ambulatory Visit
Admission: RE | Admit: 2019-04-12 | Discharge: 2019-04-12 | Disposition: A | Discharge status: Home / Self Care | Payer: Self-pay | Source: Ambulatory Visit | Attending: Nurse Practitioner | Admitting: Nurse Practitioner

## 2019-04-12 ENCOUNTER — Other Ambulatory Visit: Payer: Self-pay | Admitting: Nurse Practitioner

## 2019-04-12 ENCOUNTER — Other Ambulatory Visit: Payer: Self-pay

## 2019-04-12 DIAGNOSIS — S4992XA Unspecified injury of left shoulder and upper arm, initial encounter: Secondary | ICD-10-CM

## 2019-04-12 DIAGNOSIS — M25512 Pain in left shoulder: Secondary | ICD-10-CM

## 2019-05-31 DIAGNOSIS — M25519 Pain in unspecified shoulder: Secondary | ICD-10-CM | POA: Insufficient documentation

## 2019-09-26 ENCOUNTER — Other Ambulatory Visit: Payer: Self-pay | Admitting: Family Medicine

## 2019-09-26 DIAGNOSIS — I10 Essential (primary) hypertension: Secondary | ICD-10-CM

## 2019-09-26 NOTE — Telephone Encounter (Signed)
Requested medications are due for refill today?  Yes  Requested medications are on active medication list?  Yes   Last Refill:  10/02/2018  #30 with 11 refills  Future visit scheduled? No   Notes to Clinic:  Medication failed RX refill protocol due to no valid encounter in the past six months and no labs within last 180 days. Last office visit was 11 months ago and last labs were performed on 03/22/2018.

## 2019-11-29 ENCOUNTER — Ambulatory Visit: Payer: Self-pay

## 2019-11-29 NOTE — Telephone Encounter (Signed)
Patient called a gives history of Rt side abdominal pain under her rt rib area that wraps to her back. She states it was ocasional but over that last two weeks has become more regular.  She rates the pain at 4-5.  She states the pressure is always but the pain come whenever she eats or drinks.  She feels her abdomin is larger. She states that she had major abdominal surgery a few years ago. She has had these symptoms in the past and they tested her for gull bladder but it was negative. Per protocol appointment was scheduled for tomorrow.   Reason for Disposition . Age > 60 years  Answer Assessment - Initial Assessment Questions 1. LOCATION: "Where does it hurt?"      Rt side up under rib and around to back  2. RADIATION: "Does the pain shoot anywhere else?" (e.g., chest, back)     To back 3. ONSET: "When did the pain begin?" (e.g., minutes, hours or days ago)      2 weeks 4. SUDDEN: "Gradual or sudden onset?"     Gradual sstarted with twinge  5. PATTERN "Does the pain come and go, or is it constant?"    - If constant: "Is it getting better, staying the same, or worsening?"      (Note: Constant means the pain never goes away completely; most serious pain is constant and it progresses)     - If intermittent: "How long does it last?" "Do you have pain now?"     (Note: Intermittent means the pain goes away completely between bouts)     Has pressure on the side constant pain after eating and drinking 6. SEVERITY: "How bad is the pain?"  (e.g., Scale 1-10; mild, moderate, or severe)   - MILD (1-3): doesn't interfere with normal activities, abdomen soft and not tender to touch    - MODERATE (4-7): interferes with normal activities or awakens from sleep, tender to touch    - SEVERE (8-10): excruciating pain, doubled over, unable to do any normal activities      4-5 7. RECURRENT SYMPTOM: "Have you ever had this type of stomach pain before?" If Yes, ask: "When was the last time?" and "What happened  that time?"  2003 major abdominal surgery      8. CAUSE: "What do you think is causing the stomach pain?"    Unsure comes on with eating and drinking 9. RELIEVING/AGGRAVATING FACTORS: "What makes it better or worse?" (e.g., movement, antacids, bowel movement)     eating 10. OTHER SYMPTOMS: "Has there been any vomiting, diarrhea, constipation, or urine problems?"       Bm's are soft not watery and hard to cotrol 11. PREGNANCY: "Is there any chance you are pregnant?" "When was your last menstrual period?"       N/A  Protocols used: ABDOMINAL PAIN - [facility]

## 2019-11-30 ENCOUNTER — Ambulatory Visit (INDEPENDENT_AMBULATORY_CARE_PROVIDER_SITE_OTHER): Payer: 59 | Admitting: Family Medicine

## 2019-11-30 ENCOUNTER — Other Ambulatory Visit: Payer: Self-pay

## 2019-11-30 VITALS — BP 159/71 | HR 88 | Temp 98.4°F | Wt 190.0 lb

## 2019-11-30 DIAGNOSIS — R109 Unspecified abdominal pain: Secondary | ICD-10-CM

## 2019-11-30 NOTE — Progress Notes (Signed)
Acute Office Visit  Subjective:    Patient ID: Karen Boyd, female    DOB: 05/10/1955, 64 y.o.   MRN: [id number]  Chief Complaint  Patient presents with   Abdominal Pain    HPI Patient is in today for right side abdominal pain. Patient states that she had shoulder pain a few months ago.  After the surgery she noted some weight gain.  It did not alarm her as it wasn't too much and she knew that she had been less active due tot he surgery.  For the last 5-6 weeks she has noticed a significant amount of weight gain and an increase in her abdominal girth.  She states that she feels bloated, even after just having something to drink. About 1 month ago she started having RUQ pain.  At times it is sharp stabbing pain. She had one episode that was so severe she said it took her breath.  She developed nausea and felt like she had a pressure building up in the right side of her chest.  She said that then all of a sudden it was gone.  Since that time she has had episodes of pain and pressure.  Patient states that she had a full gall bladder work up with Dr Bary Castilla a few years ago.  He told her at that time that all of the  Tests were negative but that he believed she had something going on with her gall bladder. It is of note that the patient states she has also had a major abdominal surgery where mesh placement was involved.    Past Medical History:  Diagnosis Date   Hypertension     Past Surgical History:  Procedure Laterality Date   ABDOMINAL HYSTERECTOMY     ANKLE SURGERY     APPENDECTOMY     ELBOW SURGERY Left    TUMOR REMOVAL     abdomen    Family History  Problem Relation Age of Onset   Heart failure Father    Ovarian cancer Maternal Grandmother    Cirrhosis Paternal Grandfather    Hypertension Mother    Diabetes Mother    Gout Mother     Social History   Socioeconomic History   Marital status: Single    Spouse name: Not on file   Number of  children: Not on file   Years of education: Not on file   Highest education level: Not on file  Occupational History   Not on file  Tobacco Use   Smoking status: Never Smoker   Smokeless tobacco: Never Used  Substance and Sexual Activity   Alcohol use: No    Alcohol/week: 0.0 standard drinks   Drug use: No   Sexual activity: Not on file  Other Topics Concern   Not on file  Social History Narrative   Not on file   Social Determinants of Health   Financial Resource Strain:    Difficulty of Paying Living Expenses: Not on file  Food Insecurity:    Worried About Running Out of Food in the Last Year: Not on file   Ran Out of Food in the Last Year: Not on file  Transportation Needs:    Lack of Transportation (Medical): Not on file   Lack of Transportation (Non-Medical): Not on file  Physical Activity:    Days of Exercise per Week: Not on file   Minutes of Exercise per Session: Not on file  Stress:    Feeling of Stress : Not on  file  Social Connections:    Frequency of Communication with Friends and Family: Not on file   Frequency of Social Gatherings with Friends and Family: Not on file   Attends Religious Services: Not on Electrical engineer or Organizations: Not on file   Attends Archivist Meetings: Not on file   Marital Status: Not on file  Intimate Partner Violence:    Fear of Current or Ex-Partner: Not on file   Emotionally Abused: Not on file   Physically Abused: Not on file   Sexually Abused: Not on file    Outpatient Medications Prior to Visit  Medication Sig Dispense Refill   lisinopril (ZESTRIL) 10 MG tablet TAKE 1 TABLET BY MOUTH EVERY DAY 30 tablet 11   naproxen sodium (ALEVE) 220 MG tablet Take by mouth. (Patient not taking: Reported on 11/30/2019)     No facility-administered medications prior to visit.    Allergies  Allergen Reactions   Shellfish Allergy Anaphylaxis    Review of Systems    Constitutional: Positive for fatigue and unexpected weight change. Negative for fever.  Gastrointestinal: Positive for abdominal distention, abdominal pain and nausea. Negative for blood in stool, constipation, diarrhea, rectal pain and vomiting.       Objective:    Physical Exam Constitutional:      General: She is not in acute distress.    Appearance: She is well-developed.  HENT:     Head: Normocephalic and atraumatic.     Right Ear: Hearing normal.     Left Ear: Hearing normal.     Nose: Nose normal.  Eyes:     General: Lids are normal. No scleral icterus.       Right eye: No discharge.        Left eye: No discharge.     Conjunctiva/sclera: Conjunctivae normal.  Cardiovascular:     Rate and Rhythm: Normal rate and regular rhythm.  Pulmonary:     Effort: Pulmonary effort is normal. No respiratory distress.     Breath sounds: Normal breath sounds.  Abdominal:     General: Bowel sounds are normal.     Palpations: Abdomen is soft. There is no mass.     Comments: Minimal tenderness in the right upper abdomen and flank.  Musculoskeletal:        General: Normal range of motion.  Skin:    Findings: No lesion or rash.  Neurological:     Mental Status: She is alert and oriented to person, place, and time.  Psychiatric:        Speech: Speech normal.        Behavior: Behavior normal.        Thought Content: Thought content normal.     BP (!) 159/71 (BP Location: Right Arm, Patient Position: Sitting, Cuff Size: Normal)    Pulse 88    Temp 98.4 F (36.9 C) (Oral)    Wt 190 lb (86.2 kg)    SpO2 99%    BMI 31.62 kg/m  Wt Readings from Last 3 Encounters:  11/30/19 190 lb (86.2 kg)  10/16/18 173 lb (78.5 kg)  08/03/18 172 lb (78 kg)    Health Maintenance Due  Topic Date Due   Hepatitis C Screening  Never done   HIV Screening  Never done   TETANUS/TDAP  Never done   PAP SMEAR-Modifier  Never done   MAMMOGRAM  Never done   COLONOSCOPY  10/24/2011   INFLUENZA  VACCINE  Never done  There are no preventive care reminders to display for this patient.   Lab Results  Component Value Date   TSH 1.070 03/22/2018   Lab Results  Component Value Date   WBC 4.6 03/22/2018   HGB 12.2 03/22/2018   HCT 35.4 03/22/2018   MCV 87 03/22/2018   PLT 352 03/22/2018   Lab Results  Component Value Date   NA 137 03/22/2018   K 4.9 03/22/2018   CO2 21 03/22/2018   GLUCOSE 79 03/22/2018   BUN 13 03/22/2018   CREATININE 0.72 03/22/2018   BILITOT 0.4 03/22/2018   ALKPHOS 93 03/22/2018   AST 14 03/22/2018   ALT 10 03/22/2018   PROT 6.7 03/22/2018   ALBUMIN 4.2 03/22/2018   CALCIUM 9.3 03/22/2018   Lab Results  Component Value Date   CHOL 181 03/22/2018   Lab Results  Component Value Date   HDL 60 03/22/2018   Lab Results  Component Value Date   LDLCALC 111 (H) 03/22/2018   Lab Results  Component Value Date   TRIG 52 03/22/2018   Lab Results  Component Value Date   CHOLHDL 3.0 03/22/2018   No results found for: HGBA1C     Assessment & Plan:   1. Right sided abdominal pain Having RUQ abdominal pain with some bloating over the past month. Past GB work up has been unremarkable but this time she has noticed some relationship with higher fat content meals. Will recheck labs and get ultrasound imaging. May need referral to a surgeon pending reports. - CBC with Differential/Platelet - Comprehensive metabolic panel - Lipase - Urinalysis, Routine w reflex microscopic - US Abdomen Complete    No orders of the defined types were placed in this encounter.  Andres Shad, PA, have reviewed all documentation for this visit. The documentation on 11/30/19 for the exam, diagnosis, procedures, and orders are all accurate and complete.  Juluis Mire, CMA

## 2019-12-01 LAB — CBC WITH DIFFERENTIAL/PLATELET
Basophils Absolute: 0 10*3/uL (ref 0.0–0.2)
Basos: 1 %
EOS (ABSOLUTE): 0.2 10*3/uL (ref 0.0–0.4)
Eos: 3 %
Hematocrit: 39 % (ref 34.0–46.6)
Hemoglobin: 13.3 g/dL (ref 11.1–15.9)
Immature Grans (Abs): 0 10*3/uL (ref 0.0–0.1)
Immature Granulocytes: 0 %
Lymphocytes Absolute: 1.7 10*3/uL (ref 0.7–3.1)
Lymphs: 28 %
MCH: 30.8 pg (ref 26.6–33.0)
MCHC: 34.1 g/dL (ref 31.5–35.7)
MCV: 90 fL (ref 79–97)
Monocytes Absolute: 0.4 10*3/uL (ref 0.1–0.9)
Monocytes: 7 %
Neutrophils Absolute: 3.6 10*3/uL (ref 1.4–7.0)
Neutrophils: 61 %
Platelets: 370 10*3/uL (ref 150–450)
RBC: 4.32 x10E6/uL (ref 3.77–5.28)
RDW: 12.3 % (ref 11.7–15.4)
WBC: 5.9 10*3/uL (ref 3.4–10.8)

## 2019-12-01 LAB — URINALYSIS, ROUTINE W REFLEX MICROSCOPIC
Bilirubin, UA: NEGATIVE
Glucose, UA: NEGATIVE
Ketones, UA: NEGATIVE
Leukocytes,UA: NEGATIVE
Nitrite, UA: NEGATIVE
Protein,UA: NEGATIVE
RBC, UA: NEGATIVE
Specific Gravity, UA: 1.006 (ref 1.005–1.030)
Urobilinogen, Ur: 0.2 mg/dL (ref 0.2–1.0)
pH, UA: 6.5 (ref 5.0–7.5)

## 2019-12-01 LAB — COMPREHENSIVE METABOLIC PANEL
ALT: 39 IU/L — ABNORMAL HIGH (ref 0–32)
AST: 22 IU/L (ref 0–40)
Albumin/Globulin Ratio: 1.4 (ref 1.2–2.2)
Albumin: 4.5 g/dL (ref 3.8–4.8)
Alkaline Phosphatase: 108 IU/L (ref 44–121)
BUN/Creatinine Ratio: 18 (ref 12–28)
BUN: 13 mg/dL (ref 8–27)
Bilirubin Total: 0.5 mg/dL (ref 0.0–1.2)
CO2: 23 mmol/L (ref 20–29)
Calcium: 9.7 mg/dL (ref 8.7–10.3)
Chloride: 101 mmol/L (ref 96–106)
Creatinine, Ser: 0.73 mg/dL (ref 0.57–1.00)
GFR calc Af Amer: 103 mL/min/{1.73_m2} (ref >59)
GFR calc non Af Amer: 89 mL/min/{1.73_m2} (ref >59)
Globulin, Total: 3.2 g/dL (ref 1.5–4.5)
Glucose: 86 mg/dL (ref 65–99)
Potassium: 4.5 mmol/L (ref 3.5–5.2)
Sodium: 139 mmol/L (ref 134–144)
Total Protein: 7.7 g/dL (ref 6.0–8.5)

## 2019-12-01 LAB — LIPASE: Lipase: 22 U/L (ref 14–72)

## 2019-12-11 ENCOUNTER — Other Ambulatory Visit: Payer: Self-pay

## 2019-12-11 ENCOUNTER — Ambulatory Visit
Admission: RE | Admit: 2019-12-11 | Discharge: 2019-12-11 | Disposition: A | Discharge status: Home / Self Care | Payer: 59 | Source: Ambulatory Visit | Attending: Family Medicine | Admitting: Family Medicine

## 2019-12-11 DIAGNOSIS — R109 Unspecified abdominal pain: Secondary | ICD-10-CM | POA: Diagnosis not present

## 2019-12-14 ENCOUNTER — Other Ambulatory Visit: Payer: Self-pay | Admitting: Family Medicine

## 2019-12-14 DIAGNOSIS — R109 Unspecified abdominal pain: Secondary | ICD-10-CM

## 2019-12-14 DIAGNOSIS — K8020 Calculus of gallbladder without cholecystitis without obstruction: Secondary | ICD-10-CM

## 2019-12-14 NOTE — Progress Notes (Signed)
Placed order for surgical referral in the chart.

## 2019-12-26 ENCOUNTER — Other Ambulatory Visit: Payer: Self-pay

## 2019-12-26 ENCOUNTER — Encounter: Payer: Self-pay | Admitting: Surgery

## 2019-12-26 ENCOUNTER — Ambulatory Visit: Payer: 59 | Admitting: Surgery

## 2019-12-26 ENCOUNTER — Telehealth: Payer: Self-pay

## 2019-12-26 VITALS — BP 174/100 | HR 84 | Temp 98.5°F | Ht 65.0 in | Wt 188.0 lb

## 2019-12-26 DIAGNOSIS — K8020 Calculus of gallbladder without cholecystitis without obstruction: Secondary | ICD-10-CM | POA: Diagnosis not present

## 2019-12-26 NOTE — H&P (View-Only) (Signed)
12/26/2019  Reason for Visit:  Symptomatic cholelithiasis  Referring Provider:  Dennis Chrismon, PA-C  History of Present Illness: Karen Boyd is a 64 y.o. female presenting for evaluation of RUQ pain.  The patient reports that for about 2 months now, she's been having intermittent episodes of flareups of RUQ pain, associated with nausea.  The pain radiates to the back.  The episodes are overall short in duration, but she reports that she still has lingering soreness/tender issues in the RUQ.  She has overall been dealing with this issue for many years and had prior workups by Dr. Wohl and Dr. Byrnett in 2007 and 2010 respectively.  She had prior HIDA scan and U/S in 2007 which did not show any gallstones or dyskinesia, and she had a repeat HIDA scan in 2010 which was again normal.  However, there was high suspicion that this was her gallbladder as a culprit, but given the lack of objective data, she did not want to take the risk of surgery.  She has learned to live with the pain.  Most recently, about two months ago, she had a severe flare-up and has been having more issues since.  She saw her PCP on 11/30/19 and an U/S was obtained.  This time, it did show cholelithiasis, with a 1 cm mobile gallstone.    Of note, the patient has a history of open TAH/BSO for rapidly enlarging uterine mass in 2003.  Pathology showed a large 18 cm leiomyoma, negative for malignancy.  The patient reports that the mass was so large that it was displacing her organs and pushing on both kidneys, and during surgery, "knitting" was placed.  I presume she means mesh.  She is worried that the pain she's having is due to the mesh displacing.  Past Medical History: Past Medical History:  Diagnosis Date  . Hypertension      Past Surgical History: Past Surgical History:  Procedure Laterality Date  . ANKLE SURGERY    . APPENDECTOMY    . cataract surgery x3  2020-2021   2 in 2020 and 1 in 2021  . ELBOW SURGERY  Left   . TOTAL ABDOMINAL HYSTERECTOMY W/ BILATERAL SALPINGOOPHORECTOMY  08/16/2001   large 18 cm leiomyoma    Home Medications: Prior to Admission medications   Medication Sig Start Date End Date Taking? Authorizing Provider  lisinopril (ZESTRIL) 10 MG tablet TAKE 1 TABLET BY MOUTH EVERY DAY 09/26/19  Yes Chrismon, Dennis E, PA  methocarbamol (ROBAXIN) 750 MG tablet Take 750 mg by mouth at bedtime.   Yes [provider]    Allergies: Allergies  Allergen Reactions  . Shellfish Allergy Anaphylaxis    Social History:  reports that she has never smoked. She has never used smokeless tobacco. She reports that she does not drink alcohol and does not use drugs.   Family History: Family History  Problem Relation Age of Onset  . Heart failure Father   . Ovarian cancer Maternal Grandmother   . Cirrhosis Paternal Grandfather   . Hypertension Mother   . Diabetes Mother   . Gout Mother     Review of Systems: Review of Systems  Constitutional: Negative for chills and fever.  HENT: Negative for hearing loss.   Respiratory: Negative for shortness of breath.   Cardiovascular: Negative for chest pain.  Gastrointestinal: Positive for abdominal pain and nausea. Negative for blood in stool, constipation, diarrhea and vomiting.  Genitourinary: Negative for dysuria.  Musculoskeletal: Negative for myalgias.  Skin: Negative for   rash.  Neurological: Negative for dizziness.  Psychiatric/Behavioral: Negative for depression.    Physical Exam BP (!) 174/100   Pulse 84   Temp 98.5 F (36.9 C) (Oral)   Ht 5' 5" (1.651 m)   Wt 188 lb (85.3 kg)   SpO2 97%   BMI 31.28 kg/m  CONSTITUTIONAL: No acute distress HEENT:  Normocephalic, atraumatic, extraocular motion intact. NECK: Trachea is midline, and there is no jugular venous distension.  RESPIRATORY:  Normal respiratory effort without pathologic use of accessory muscles. CARDIOVASCULAR:  Regular rhythm and rate. GI: The abdomen is  soft, non-distended, with tenderness to palpation in the RUQ.  Negative Murphy's sign.  Low midline incision well healed, no evidence of hernia.   MUSCULOSKELETAL:  Normal muscle strength and tone in all four extremities.  No peripheral edema or cyanosis. SKIN: Skin turgor is normal. There are no pathologic skin lesions.  NEUROLOGIC:  Motor and sensation is grossly normal.  Cranial nerves are grossly intact. PSYCH:  Alert and oriented to person, place and time. Affect is normal.  Laboratory Analysis: Labs 11/30/19: Na 139, K 4.5, Cl 101, CO2 23, BUN 13, Cr 0.73.  Total bili 0.5, AST 22, ALT 39, Alk phos 108, lipase 22.  WBC 5.9, Hgb 13.3, Hct 39, Plt 370  Imaging: U/S abdomen 12/11/19: IMPRESSION: Cholelithiasis.  No sonographic evidence of acute cholecystitis.  Prominent common bile duct at 8 mm. The distal duct cannot be visualized due to overlying bowel gas. Recommend correlation with LFTs. If further evaluation is felt warranted, MRCP may be beneficial to exclude distal obstructing process or stone.  Hepatic steatosis.  Assessment and Plan: This is a 64 y.o. female with RUQ pain and cholelithiasis on U/S.  --Discussed with the patient the reasoning for suspecting all along that this was a gallbladder etiology, with her RUQ pain, nausea, radiation to the back, and episodic nature.  Previously, all her studies had been normal.  However, now we have the same symptoms as before, but with U/S finding of cholelithiasis.  I suspect previously she may have had sludge which was not well seen. --With regards to the possible mesh previously used in 2003.  Although I do not have operative notes available on her chart, it is unclear what mesh could have been used or the purpose of it for the type of surgery she had in 2003.  If there was mesh placed back then, it would be very unlikely that 18 years later it would move or become displaced.  There is no evidence of hernia on her exam  today. --Discussed with her that given that this has been ongoing for so many years, there is a chance that surgery does not fix her symptoms.  However, now we do have objective data to support the suspicions from years ago and I think it's more reasonable to proceed with surgery.  She is in agreement.  We discussed the role for minimally invasive, robotic assisted cholecystectomy. Discussed the risks of bleeding, infection, injury to surrounding structures.  Discussed that we would use ICG to better visualize the biliary anatomy intraoperatively.  She is willing to proceed.  She understands that she will need COVID-19 testing prior to surgery, and we'll also send medical clearance to her PCP. --She'll be scheduled for 01/08/20.  Face-to-face time spent with the patient and care providers was 60 minutes, with more than 50% of the time spent counseling, educating, and coordinating care of the patient.      Luis , MD   [facility]   

## 2019-12-26 NOTE — Telephone Encounter (Signed)
Medical Clearance faxed to Dr.Dennis Chrismon [phone].

## 2019-12-26 NOTE — Progress Notes (Signed)
12/26/2019  Reason for Visit:  Symptomatic cholelithiasis  Referring Provider:  Vernie Murders, PA-C  History of Present Illness: Karen Boyd is a 64 y.o. female presenting for evaluation of RUQ pain.  The patient reports that for about 2 months now, she's been having intermittent episodes of flareups of RUQ pain, associated with nausea.  The pain radiates to the back.  The episodes are overall short in duration, but she reports that she still has lingering soreness/tender issues in the RUQ.  She has overall been dealing with this issue for many years and had prior workups by Dr. Allen Norris and Dr. Bary Castilla in 2007 and 2010 respectively.  She had prior HIDA scan and U/S in 2007 which did not show any gallstones or dyskinesia, and she had a repeat HIDA scan in 2010 which was again normal.  However, there was high suspicion that this was her gallbladder as a culprit, but given the lack of objective data, she did not want to take the risk of surgery.  She has learned to live with the pain.  Most recently, about two months ago, she had a severe flare-up and has been having more issues since.  She saw her PCP on 11/30/19 and an U/S was obtained.  This time, it did show cholelithiasis, with a 1 cm mobile gallstone.    Of note, the patient has a history of open TAH/BSO for rapidly enlarging uterine mass in 2003.  Pathology showed a large 18 cm leiomyoma, negative for malignancy.  The patient reports that the mass was so large that it was displacing her organs and pushing on both kidneys, and during surgery, "knitting" was placed.  I presume she means mesh.  She is worried that the pain she's having is due to the mesh displacing.  Past Medical History: Past Medical History:  Diagnosis Date  . Hypertension      Past Surgical History: Past Surgical History:  Procedure Laterality Date  . ANKLE SURGERY    . APPENDECTOMY    . cataract surgery x3  2020-2021   2 in 2020 and 1 in 2021  . ELBOW SURGERY  Left   . TOTAL ABDOMINAL HYSTERECTOMY W/ BILATERAL SALPINGOOPHORECTOMY  08/16/2001   large 18 cm leiomyoma    Home Medications: Prior to Admission medications   Medication Sig Start Date End Date Taking? Authorizing Provider  lisinopril (ZESTRIL) 10 MG tablet TAKE 1 TABLET BY MOUTH EVERY DAY 09/26/19  Yes Chrismon, Vickki Muff, PA  methocarbamol (ROBAXIN) 750 MG tablet Take 750 mg by mouth at bedtime.   Yes [provider]    Allergies: Allergies  Allergen Reactions  . Shellfish Allergy Anaphylaxis    Social History:  reports that she has never smoked. She has never used smokeless tobacco. She reports that she does not drink alcohol and does not use drugs.   Family History: Family History  Problem Relation Age of Onset  . Heart failure Father   . Ovarian cancer Maternal Grandmother   . Cirrhosis Paternal Grandfather   . Hypertension Mother   . Diabetes Mother   . Gout Mother     Review of Systems: Review of Systems  Constitutional: Negative for chills and fever.  HENT: Negative for hearing loss.   Respiratory: Negative for shortness of breath.   Cardiovascular: Negative for chest pain.  Gastrointestinal: Positive for abdominal pain and nausea. Negative for blood in stool, constipation, diarrhea and vomiting.  Genitourinary: Negative for dysuria.  Musculoskeletal: Negative for myalgias.  Skin: Negative for  rash.  Neurological: Negative for dizziness.  Psychiatric/Behavioral: Negative for depression.    Physical Exam BP (!) 174/100   Pulse 84   Temp 98.5 F (36.9 C) (Oral)   Ht 5' 5" (1.651 m)   Wt 188 lb (85.3 kg)   SpO2 97%   BMI 31.28 kg/m  CONSTITUTIONAL: No acute distress HEENT:  Normocephalic, atraumatic, extraocular motion intact. NECK: Trachea is midline, and there is no jugular venous distension.  RESPIRATORY:  Normal respiratory effort without pathologic use of accessory muscles. CARDIOVASCULAR:  Regular rhythm and rate. GI: The abdomen is  soft, non-distended, with tenderness to palpation in the RUQ.  Negative Murphy's sign.  Low midline incision well healed, no evidence of hernia.   MUSCULOSKELETAL:  Normal muscle strength and tone in all four extremities.  No peripheral edema or cyanosis. SKIN: Skin turgor is normal. There are no pathologic skin lesions.  NEUROLOGIC:  Motor and sensation is grossly normal.  Cranial nerves are grossly intact. PSYCH:  Alert and oriented to person, place and time. Affect is normal.  Laboratory Analysis: Labs 11/30/19: Na 139, K 4.5, Cl 101, CO2 23, BUN 13, Cr 0.73.  Total bili 0.5, AST 22, ALT 39, Alk phos 108, lipase 22.  WBC 5.9, Hgb 13.3, Hct 39, Plt 370  Imaging: U/S abdomen 12/11/19: IMPRESSION: Cholelithiasis.  No sonographic evidence of acute cholecystitis.  Prominent common bile duct at 8 mm. The distal duct cannot be visualized due to overlying bowel gas. Recommend correlation with LFTs. If further evaluation is felt warranted, MRCP may be beneficial to exclude distal obstructing process or stone.  Hepatic steatosis.  Assessment and Plan: This is a 64 y.o. female with RUQ pain and cholelithiasis on U/S.  --Discussed with the patient the reasoning for suspecting all along that this was a gallbladder etiology, with her RUQ pain, nausea, radiation to the back, and episodic nature.  Previously, all her studies had been normal.  However, now we have the same symptoms as before, but with U/S finding of cholelithiasis.  I suspect previously she may have had sludge which was not well seen. --With regards to the possible mesh previously used in 2003.  Although I do not have operative notes available on her chart, it is unclear what mesh could have been used or the purpose of it for the type of surgery she had in 2003.  If there was mesh placed back then, it would be very unlikely that 18 years later it would move or become displaced.  There is no evidence of hernia on her exam  today. --Discussed with her that given that this has been ongoing for so many years, there is a chance that surgery does not fix her symptoms.  However, now we do have objective data to support the suspicions from years ago and I think it's more reasonable to proceed with surgery.  She is in agreement.  We discussed the role for minimally invasive, robotic assisted cholecystectomy. Discussed the risks of bleeding, infection, injury to surrounding structures.  Discussed that we would use ICG to better visualize the biliary anatomy intraoperatively.  She is willing to proceed.  She understands that she will need COVID-19 testing prior to surgery, and we'll also send medical clearance to her PCP. --She'll be scheduled for 01/08/20.  Face-to-face time spent with the patient and care providers was 60 minutes, with more than 50% of the time spent counseling, educating, and coordinating care of the patient.      Luis , MD   [facility]

## 2019-12-26 NOTE — Patient Instructions (Addendum)
Give [place] a call to let us know when surgery will work best for you. Dr. Hampton Abbot is available on Tuesdays and Thursdays.  Have your blue sheet with you and ready to write down your surgery appointment, covid screening directions, and any instructions you may need.    Laparoscopic Cholecystectomy Laparoscopic cholecystectomy is surgery to remove the gallbladder. The gallbladder is a pear-shaped organ that lies beneath the liver on the right side of the body. The gallbladder stores bile, which is a fluid that helps the body to digest fats. Cholecystectomy is often done for inflammation of the gallbladder (cholecystitis). This condition is usually caused by a buildup of gallstones (cholelithiasis) in the gallbladder. Gallstones can block the flow of bile, which can result in inflammation and pain. In severe cases, emergency surgery may be required. This procedure is done though small incisions in your abdomen (laparoscopic surgery). A thin scope with a camera (laparoscope) is inserted through one incision. Thin surgical instruments are inserted through the other incisions. In some cases, a laparoscopic procedure may be turned into a type of surgery that is done through a larger incision (open surgery). Tell a health care provider about:  Any allergies you have.  All medicines you are taking, including vitamins, herbs, eye drops, creams, and over-the-counter medicines.  Any problems you or family members have had with anesthetic medicines.  Any blood disorders you have.  Any surgeries you have had.  Any medical conditions you have.  Whether you are pregnant or may be pregnant. What are the risks? Generally, this is a safe procedure. However, problems may occur, including:  Infection.  Bleeding.  Allergic reactions to medicines.  Damage to other structures or organs.  A stone remaining in the common bile duct. The common bile duct carries bile from the gallbladder into the small  intestine.  A bile leak from the cyst duct that is clipped when your gallbladder is removed. What happens before the procedure? Staying hydrated Follow instructions from your health care provider about hydration, which may include:  Up to 2 hours before the procedure - you may continue to drink clear liquids, such as water, clear fruit juice, black coffee, and plain tea. Eating and drinking restrictions Follow instructions from your health care provider about eating and drinking, which may include:  8 hours before the procedure - stop eating heavy meals or foods such as meat, fried foods, or fatty foods.  6 hours before the procedure - stop eating light meals or foods, such as toast or cereal.  6 hours before the procedure - stop drinking milk or drinks that contain milk.  2 hours before the procedure - stop drinking clear liquids. Medicines  Ask your health care provider about: ? Changing or stopping your regular medicines. This is especially important if you are taking diabetes medicines or blood thinners. ? Taking medicines such as aspirin and ibuprofen. These medicines can thin your blood. Do not take these medicines before your procedure if your health care provider instructs you not to.  You may be given antibiotic medicine to help prevent infection. General instructions  Let your health care provider know if you develop a cold or an infection before surgery.  Plan to have someone take you home from the hospital or clinic.  Ask your health care provider how your surgical site will be marked or identified. What happens during the procedure?   To reduce your risk of infection: ? Your health care team will wash or sanitize their hands. ?  Your skin will be washed with soap. ? Hair may be removed from the surgical area.  An IV tube may be inserted into one of your veins.  You will be given one or more of the following: ? A medicine to help you relax (sedative). ? A  medicine to make you fall asleep (general anesthetic).  A breathing tube will be placed in your mouth.  Your surgeon will make several small cuts (incisions) in your abdomen.  The laparoscope will be inserted through one of the small incisions. The camera on the laparoscope will send images to a TV screen (monitor) in the operating room. This lets your surgeon see inside your abdomen.  Air-like gas will be pumped into your abdomen. This will expand your abdomen to give the surgeon more room to perform the surgery.  Other tools that are needed for the procedure will be inserted through the other incisions. The gallbladder will be removed through one of the incisions.  Your common bile duct may be examined. If stones are found in the common bile duct, they may be removed.  After your gallbladder has been removed, the incisions will be closed with stitches (sutures), staples, or skin glue.  Your incisions may be covered with a bandage (dressing). The procedure may vary among health care providers and hospitals. What happens after the procedure?  Your blood pressure, heart rate, breathing rate, and blood oxygen level will be monitored until the medicines you were given have worn off.  You will be given medicines as needed to control your pain.  Do not drive for 24 hours if you were given a sedative. This information is not intended to replace advice given to you by your health care provider. Make sure you discuss any questions you have with your health care provider. Document Revised: 12/31/2016 Document Reviewed: 07/07/2015 Elsevier Patient Education  [place].      Cholelithiasis  Cholelithiasis is also called "gallstones." It is a kind of gallbladder disease. The gallbladder is an organ that stores a liquid (bile) that helps you digest fat. Gallstones may not cause symptoms (may be silent gallstones) until they cause a blockage, and then they can cause pain (gallbladder  attack). Follow these instructions at home:  Take over-the-counter and prescription medicines only as told by your doctor.  Stay at a healthy weight.  Eat healthy foods. This includes: ? Eating fewer fatty foods, like fried foods. ? Eating fewer refined carbs (refined carbohydrates). Refined carbs are breads and grains that are highly processed, like white bread and white rice. Instead, choose whole grains like whole-wheat bread and brown rice. ? Eating more fiber. Almonds, fresh fruit, and beans are healthy sources of fiber.  Keep all follow-up visits as told by your doctor. This is important. Contact a doctor if:  You have sudden pain in the upper right side of your belly (abdomen). Pain might spread to your right shoulder or your chest. This may be a sign of a gallbladder attack.  You feel sick to your stomach (are nauseous).  You throw up (vomit).  You have been diagnosed with gallstones that have no symptoms and you get: ? Belly pain. ? Discomfort, burning, or fullness in the upper part of your belly (indigestion). Get help right away if:  You have sudden pain in the upper right side of your belly, and it lasts for more than 2 hours.  You have belly pain that lasts for more than 5 hours.  You have a  fever or chills.  You keep feeling sick to your stomach or you keep throwing up.  Your skin or the whites of your eyes turn yellow (jaundice).  You have dark-colored pee (urine).  You have light-colored poop (stool). Summary  Cholelithiasis is also called "gallstones."  The gallbladder is an organ that stores a liquid (bile) that helps you digest fat.  Silent gallstones are gallstones that do not cause symptoms.  A gallbladder attack may cause sudden pain in the upper right side of your belly. Pain might spread to your right shoulder or your chest. If this happens, contact your doctor.  If you have sudden pain in the upper right side of your belly that lasts for more  than 2 hours, get help right away. This information is not intended to replace advice given to you by your health care provider. Make sure you discuss any questions you have with your health care provider. Document Revised: 12/31/2016 Document Reviewed: 10/05/2015 Elsevier Patient Education  2020 [employer].

## 2019-12-31 ENCOUNTER — Encounter: Payer: Self-pay | Admitting: Family Medicine

## 2019-12-31 ENCOUNTER — Telehealth: Payer: Self-pay

## 2019-12-31 ENCOUNTER — Telehealth: Payer: Self-pay | Admitting: Surgery

## 2019-12-31 NOTE — Telephone Encounter (Signed)
Completed form for [facility] surgery clearance.

## 2019-12-31 NOTE — Telephone Encounter (Signed)
Copied from [place] [id number]. Topic: General - Inquiry >> Dec 31, 2019 10:42 AM Lennox Solders wrote: Reason for CRM: [facility] will refax over the medical clearance form. The form was fax to wrong fax number. Pt is surgery for surgery on 01-08-2020

## 2019-12-31 NOTE — Telephone Encounter (Signed)
Patient has been advised of Pre-Admission date/time, COVID Testing date and Surgery date.  Surgery Date: 01/08/20 Preadmission Testing Date: 01/03/20 (phone 1p-5p) Covid Testing Date: 01/04/20 - patient advised to go to the [facility] between 8a-1p   Patient has been made aware to call [phone], between 1-3:00pm the day before surgery, to find out what time to arrive for surgery.

## 2020-01-01 ENCOUNTER — Encounter: Payer: Self-pay | Admitting: Family Medicine

## 2020-01-01 NOTE — Progress Notes (Signed)
Medical Clearance received from [place] office. The patient is cleared for surgery at Low risk.

## 2020-01-03 ENCOUNTER — Encounter
Admission: RE | Admit: 2020-01-03 | Discharge: 2020-01-03 | Disposition: A | Discharge status: Home / Self Care | Payer: 59 | Source: Ambulatory Visit | Attending: Surgery | Admitting: Surgery

## 2020-01-03 ENCOUNTER — Other Ambulatory Visit: Payer: Self-pay

## 2020-01-03 DIAGNOSIS — I10 Essential (primary) hypertension: Secondary | ICD-10-CM | POA: Diagnosis not present

## 2020-01-03 DIAGNOSIS — Z20822 Contact with and (suspected) exposure to covid-19: Secondary | ICD-10-CM | POA: Diagnosis not present

## 2020-01-03 DIAGNOSIS — Z01818 Encounter for other preprocedural examination: Secondary | ICD-10-CM | POA: Insufficient documentation

## 2020-01-03 HISTORY — DX: Gastro-esophageal reflux disease without esophagitis: K21.9

## 2020-01-03 NOTE — Patient Instructions (Signed)
Your procedure is scheduled on: Tuesday 01/08/20.  Report to THE FIRST FLOOR REGISTRATION DESK IN THE MEDICAL MALL ON THE MORNING OF SURGERY FIRST, THEN YOU WILL CHECK IN AT THE SURGERY INFORMATION DESK LOCATED OUTSIDE THE SAME DAY SURGERY DEPARTMENT LOCATED ON 2ND FLOOR MEDICAL MALL ENTRANCE.  To find out your arrival time please call [phone] between 1PM - 3PM on Monday 01/07/20.   Remember: Instructions that are not followed completely may result in serious medical risk, up to and including death, or upon the discretion of your surgeon and anesthesiologist your surgery may need to be rescheduled.     __X__ 1. Do not eat food after midnight the night before your procedure.                 No gum chewing or hard candies. You may drink clear liquids up to 2 hours                 before you are scheduled to arrive for your surgery- DO NOT drink clear                 liquids within 2 hours of the start of your surgery.                 Clear Liquids include:  water, apple juice without pulp, clear carbohydrate                 drink such as Clearfast or Gatorade, Black Coffee or Tea (Do not add                 milk or creamer to coffee or tea).  __X__2.  On the morning of surgery brush your teeth with toothpaste and water, you may rinse your mouth with mouthwash if you wish.  Do not swallow any toothpaste or mouthwash.    __X__ 3.  No Alcohol for 24 hours before or after surgery.  __X__ 4.  Do Not Smoke or use e-cigarettes For 24 Hours Prior to Your Surgery.                 Do not use any chewable tobacco products for at least 6 hours prior to                 surgery.  __X__5.  Notify your doctor if there is any change in your medical condition      (cold, fever, infections).      Do NOT wear jewelry, make-up, hairpins, clips or nail polish. Do NOT wear lotions, powders, or perfumes.  Do NOT shave 48 hours prior to surgery. Men may shave face and neck. Do NOT bring valuables to the  hospital.     [facility] is not responsible for any belongings or valuables.   Contacts, dentures/partials or body piercings may not be worn into surgery. Bring a case for your contacts, glasses or hearing aids, a denture cup will be supplied. Leave your suitcase in the car. After surgery it may be brought to your room.   For patients admitted to the hospital, discharge time is determined by your treatment team.    Patients discharged the day of surgery will not be allowed to drive home.     __X__ Take these medicines the morning of surgery with A SIP OF WATER:     1. Olopatadine HCl (PATADAY OP)  2. methocarbamol (ROBAXIN) if needed    __X__ Use CHG Soap as directed    __X__  Stop Anti-inflammatories 7 days before surgery such as Advil, Ibuprofen, Motrin, BC or Goodies Powder, Naprosyn, Naproxen, Aleve, Aspirin, Meloxicam. May take Tylenol if needed for pain or discomfort.   __X__Do not start taking any new herbal supplements or vitamins prior to your procedure.     Wear comfortable clothing (specific to your surgery type) to the hospital.  Plan for stool softeners for home use; pain medications have a tendency to cause constipation. You can also help prevent constipation by eating foods high in fiber such as fruits and vegetables and drinking plenty of fluids as your diet allows.  After surgery, you can prevent lung complications by doing breathing exercises.Take deep breaths and cough every 1-2 hours. Your doctor may order a device called an Incentive Spirometer to help you take deep breaths.  Please call the [place] Department at [phone] if you have any questions about these instructions.

## 2020-01-04 ENCOUNTER — Encounter
Admission: RE | Admit: 2020-01-04 | Discharge: 2020-01-04 | Disposition: A | Discharge status: Home / Self Care | Payer: 59 | Source: Ambulatory Visit | Attending: Surgery | Admitting: Surgery

## 2020-01-04 DIAGNOSIS — Z01818 Encounter for other preprocedural examination: Secondary | ICD-10-CM | POA: Diagnosis not present

## 2020-01-06 LAB — SARS CORONAVIRUS 2 (TAT 6-24 HRS): SARS Coronavirus 2: NEGATIVE

## 2020-01-07 MED ORDER — INDOCYANINE GREEN 25 MG IV SOLR
2.5000 mg | INTRAVENOUS | Status: DC
  Filled 2020-01-07: qty 10

## 2020-01-08 ENCOUNTER — Ambulatory Visit: Payer: 59 | Admitting: Anesthesiology

## 2020-01-08 ENCOUNTER — Other Ambulatory Visit: Payer: Self-pay

## 2020-01-08 ENCOUNTER — Encounter: Payer: Self-pay | Admitting: Surgery

## 2020-01-08 ENCOUNTER — Ambulatory Visit
Admission: RE | Admit: 2020-01-08 | Discharge: 2020-01-08 | Disposition: A | Discharge status: Home / Self Care | Payer: 59 | Attending: Surgery | Admitting: Surgery

## 2020-01-08 ENCOUNTER — Encounter
Admission: RE | Disposition: A | Discharge status: Home / Self Care | Payer: Self-pay | Source: Home / Self Care | Attending: Surgery

## 2020-01-08 DIAGNOSIS — Z90722 Acquired absence of ovaries, bilateral: Secondary | ICD-10-CM | POA: Insufficient documentation

## 2020-01-08 DIAGNOSIS — Z90710 Acquired absence of both cervix and uterus: Secondary | ICD-10-CM | POA: Insufficient documentation

## 2020-01-08 DIAGNOSIS — K8010 Calculus of gallbladder with chronic cholecystitis without obstruction: Secondary | ICD-10-CM | POA: Insufficient documentation

## 2020-01-08 DIAGNOSIS — Z79899 Other long term (current) drug therapy: Secondary | ICD-10-CM | POA: Diagnosis not present

## 2020-01-08 DIAGNOSIS — K8020 Calculus of gallbladder without cholecystitis without obstruction: Secondary | ICD-10-CM

## 2020-01-08 SURGERY — CHOLECYSTECTOMY, ROBOT-ASSISTED, LAPAROSCOPIC
Anesthesia: General

## 2020-01-08 MED ORDER — CHLORHEXIDINE GLUCONATE 0.12 % MT SOLN
OROMUCOSAL | Status: AC
  Filled 2020-01-08: qty 15

## 2020-01-08 MED ORDER — ONDANSETRON HCL 4 MG/2ML IJ SOLN: 4.0000 mg | Freq: Once | INTRAMUSCULAR | Status: AC | PRN

## 2020-01-08 MED ORDER — SUGAMMADEX SODIUM 200 MG/2ML IV SOLN
INTRAVENOUS | Status: DC | PRN
  Administered 2020-01-08: 200 mg via INTRAVENOUS

## 2020-01-08 MED ORDER — ONDANSETRON HCL 4 MG/2ML IJ SOLN
INTRAMUSCULAR | Status: DC | PRN
  Administered 2020-01-08: 4 mg via INTRAVENOUS

## 2020-01-08 MED ORDER — BUPIVACAINE-EPINEPHRINE (PF) 0.25% -1:200000 IJ SOLN
INTRAMUSCULAR | Status: DC | PRN
  Administered 2020-01-08: 30 mL via PERINEURAL

## 2020-01-08 MED ORDER — MIDAZOLAM HCL 2 MG/2ML IJ SOLN
INTRAMUSCULAR | Status: DC | PRN
  Administered 2020-01-08: 2 mg via INTRAVENOUS

## 2020-01-08 MED ORDER — CEFAZOLIN SODIUM-DEXTROSE 1-4 GM/50ML-% IV SOLN
INTRAVENOUS | Status: AC
  Filled 2020-01-08: qty 100

## 2020-01-08 MED ORDER — SODIUM CHLORIDE FLUSH 0.9 % IV SOLN
INTRAVENOUS | Status: AC
  Filled 2020-01-08: qty 10

## 2020-01-08 MED ORDER — OXYCODONE HCL 5 MG PO TABS: 5.0000 mg | ORAL_TABLET | ORAL | 0 refills | Status: DC | PRN

## 2020-01-08 MED ORDER — PROPOFOL 10 MG/ML IV BOLUS
INTRAVENOUS | Status: AC
  Filled 2020-01-08: qty 20

## 2020-01-08 MED ORDER — FENTANYL CITRATE (PF) 100 MCG/2ML IJ SOLN
INTRAMUSCULAR | Status: AC
  Administered 2020-01-08: 25 ug via INTRAVENOUS
  Filled 2020-01-08: qty 2

## 2020-01-08 MED ORDER — FAMOTIDINE 20 MG PO TABS
ORAL_TABLET | ORAL | Status: AC
  Filled 2020-01-08: qty 1

## 2020-01-08 MED ORDER — CHLORHEXIDINE GLUCONATE CLOTH 2 % EX PADS: 6.0000 | MEDICATED_PAD | Freq: Once | CUTANEOUS | Status: DC

## 2020-01-08 MED ORDER — FENTANYL CITRATE (PF) 100 MCG/2ML IJ SOLN
INTRAMUSCULAR | Status: AC
  Filled 2020-01-08: qty 4

## 2020-01-08 MED ORDER — MIDAZOLAM HCL 2 MG/2ML IJ SOLN
INTRAMUSCULAR | Status: AC
  Filled 2020-01-08: qty 2

## 2020-01-08 MED ORDER — ACETAMINOPHEN 500 MG PO TABS
1000.0000 mg | ORAL_TABLET | ORAL | Status: AC
  Administered 2020-01-08: 1000 mg via ORAL

## 2020-01-08 MED ORDER — ONDANSETRON HCL 4 MG/2ML IJ SOLN
INTRAMUSCULAR | Status: AC
  Administered 2020-01-08: 4 mg via INTRAVENOUS
  Filled 2020-01-08: qty 2

## 2020-01-08 MED ORDER — PHENYLEPHRINE HCL (PRESSORS) 10 MG/ML IV SOLN
INTRAVENOUS | Status: DC | PRN
  Administered 2020-01-08 (×5): 100 ug via INTRAVENOUS

## 2020-01-08 MED ORDER — GABAPENTIN 300 MG PO CAPS
300.0000 mg | ORAL_CAPSULE | ORAL | Status: AC
  Administered 2020-01-08: 300 mg via ORAL

## 2020-01-08 MED ORDER — ROCURONIUM BROMIDE 100 MG/10ML IV SOLN
INTRAVENOUS | Status: DC | PRN
  Administered 2020-01-08: 50 mg via INTRAVENOUS
  Administered 2020-01-08: 20 mg via INTRAVENOUS

## 2020-01-08 MED ORDER — ORAL CARE MOUTH RINSE: 15.0000 mL | Freq: Once | OROMUCOSAL | Status: AC

## 2020-01-08 MED ORDER — BUPIVACAINE-EPINEPHRINE (PF) 0.25% -1:200000 IJ SOLN
INTRAMUSCULAR | Status: AC
  Filled 2020-01-08: qty 30

## 2020-01-08 MED ORDER — FENTANYL CITRATE (PF) 100 MCG/2ML IJ SOLN
25.0000 ug | INTRAMUSCULAR | Status: DC | PRN
  Administered 2020-01-08 (×3): 25 ug via INTRAVENOUS

## 2020-01-08 MED ORDER — LIDOCAINE HCL (CARDIAC) PF 100 MG/5ML IV SOSY
PREFILLED_SYRINGE | INTRAVENOUS | Status: DC | PRN
  Administered 2020-01-08: 100 mg via INTRAVENOUS

## 2020-01-08 MED ORDER — OXYCODONE HCL 5 MG PO TABS
ORAL_TABLET | ORAL | Status: AC
  Filled 2020-01-08: qty 1

## 2020-01-08 MED ORDER — EPHEDRINE SULFATE 50 MG/ML IJ SOLN
INTRAMUSCULAR | Status: DC | PRN
  Administered 2020-01-08: 10 mg via INTRAVENOUS

## 2020-01-08 MED ORDER — IBUPROFEN 600 MG PO TABS: 600.0000 mg | ORAL_TABLET | Freq: Three times a day (TID) | ORAL | 1 refills | Status: DC | PRN

## 2020-01-08 MED ORDER — CHLORHEXIDINE GLUCONATE 0.12 % MT SOLN
15.0000 mL | Freq: Once | OROMUCOSAL | Status: AC
  Administered 2020-01-08: 15 mL via OROMUCOSAL

## 2020-01-08 MED ORDER — FENTANYL CITRATE (PF) 100 MCG/2ML IJ SOLN
INTRAMUSCULAR | Status: DC | PRN
  Administered 2020-01-08 (×3): 50 ug via INTRAVENOUS

## 2020-01-08 MED ORDER — GABAPENTIN 300 MG PO CAPS
ORAL_CAPSULE | ORAL | Status: AC
  Filled 2020-01-08: qty 1

## 2020-01-08 MED ORDER — FAMOTIDINE 20 MG PO TABS
20.0000 mg | ORAL_TABLET | Freq: Once | ORAL | Status: AC
  Administered 2020-01-08: 20 mg via ORAL

## 2020-01-08 MED ORDER — CEFAZOLIN SODIUM-DEXTROSE 2-4 GM/100ML-% IV SOLN
2.0000 g | INTRAVENOUS | Status: AC
  Administered 2020-01-08: 2 g via INTRAVENOUS

## 2020-01-08 MED ORDER — DEXAMETHASONE SODIUM PHOSPHATE 10 MG/ML IJ SOLN
INTRAMUSCULAR | Status: DC | PRN
  Administered 2020-01-08: 10 mg via INTRAVENOUS

## 2020-01-08 MED ORDER — OXYCODONE HCL 5 MG PO TABS
5.0000 mg | ORAL_TABLET | Freq: Once | ORAL | Status: AC
  Administered 2020-01-08: 5 mg via ORAL

## 2020-01-08 MED ORDER — PROPOFOL 10 MG/ML IV BOLUS
INTRAVENOUS | Status: DC | PRN
  Administered 2020-01-08: 140 mg via INTRAVENOUS

## 2020-01-08 MED ORDER — SODIUM CHLORIDE 0.9 % IV SOLN
INTRAVENOUS | Status: DC | PRN
  Administered 2020-01-08: 50 ug/min via INTRAVENOUS

## 2020-01-08 MED ORDER — LACTATED RINGERS IV SOLN: INTRAVENOUS | Status: DC

## 2020-01-08 MED ORDER — ACETAMINOPHEN 500 MG PO TABS
ORAL_TABLET | ORAL | Status: AC
  Filled 2020-01-08: qty 2

## 2020-01-08 SURGICAL SUPPLY — 58 items
ADH SKN CLS APL DERMABOND .7 (GAUZE/BANDAGES/DRESSINGS) ×1
APL PRP STRL LF DISP 70% ISPRP (MISCELLANEOUS) ×1
BAG INFUSER PRESSURE 100CC (MISCELLANEOUS) IMPLANT
BAG SPEC RTRVL LRG 6X4 10 (ENDOMECHANICALS) ×1
CANISTER SUCT 1200ML W/VALVE (MISCELLANEOUS) ×2 IMPLANT
CANNULA REDUC XI 12-8 STAPL (CANNULA) ×1
CANNULA REDUCER 12-8 DVNC XI (CANNULA) ×1 IMPLANT
CHLORAPREP W/TINT 26 (MISCELLANEOUS) ×2 IMPLANT
CLIP VESOLOCK MED LG 6/CT (CLIP) ×2 IMPLANT
COVER WAND RF STERILE (DRAPES) ×2 IMPLANT
CUP MEDICINE 2OZ PLAST GRAD ST (MISCELLANEOUS) ×2 IMPLANT
DECANTER SPIKE VIAL GLASS SM (MISCELLANEOUS) ×2 IMPLANT
DEFOGGER SCOPE WARMER CLEARIFY (MISCELLANEOUS) ×2 IMPLANT
DERMABOND ADVANCED (GAUZE/BANDAGES/DRESSINGS) ×1
DERMABOND ADVANCED .7 DNX12 (GAUZE/BANDAGES/DRESSINGS) ×1 IMPLANT
DRAPE ARM DVNC X/XI (DISPOSABLE) ×4 IMPLANT
DRAPE COLUMN DVNC XI (DISPOSABLE) ×1 IMPLANT
DRAPE DA VINCI XI ARM (DISPOSABLE) ×4
DRAPE DA VINCI XI COLUMN (DISPOSABLE) ×1
ELECT CAUTERY BLADE TIP 2.5 (TIP) ×2
ELECT REM PT RETURN 9FT ADLT (ELECTROSURGICAL) ×2
ELECTRODE CAUTERY BLDE TIP 2.5 (TIP) ×1 IMPLANT
ELECTRODE REM PT RTRN 9FT ADLT (ELECTROSURGICAL) ×1 IMPLANT
GLOVE SURG SYN 7.0 (GLOVE) ×4 IMPLANT
GLOVE SURG SYN 7.5  E (GLOVE) ×2
GLOVE SURG SYN 7.5 E (GLOVE) ×2 IMPLANT
GOWN STRL REUS W/ TWL LRG LVL3 (GOWN DISPOSABLE) ×4 IMPLANT
GOWN STRL REUS W/TWL LRG LVL3 (GOWN DISPOSABLE) ×8
IRRIGATOR SUCT 8 DISP DVNC XI (IRRIGATION / IRRIGATOR) IMPLANT
IRRIGATOR SUCTION 8MM XI DISP (IRRIGATION / IRRIGATOR)
IV NS 1000ML (IV SOLUTION)
IV NS 1000ML BAXH (IV SOLUTION) IMPLANT
KIT PINK PAD W/HEAD ARE REST (MISCELLANEOUS) ×2
KIT PINK PAD W/HEAD ARM REST (MISCELLANEOUS) ×1 IMPLANT
L-HOOK LAP DISP 36CM (ELECTROSURGICAL) ×2
LABEL OR SOLS (LABEL) ×2 IMPLANT
LHOOK LAP DISP 36CM (ELECTROSURGICAL) ×1 IMPLANT
MANIFOLD NEPTUNE II (INSTRUMENTS) ×2 IMPLANT
NEEDLE HYPO 22GX1.5 SAFETY (NEEDLE) ×2 IMPLANT
NS IRRIG 500ML POUR BTL (IV SOLUTION) ×2 IMPLANT
OBTURATOR OPTICAL STANDARD 8MM (TROCAR) ×1
OBTURATOR OPTICAL STND 8 DVNC (TROCAR) ×1
OBTURATOR OPTICALSTD 8 DVNC (TROCAR) ×1 IMPLANT
PACK LAP CHOLECYSTECTOMY (MISCELLANEOUS) ×2 IMPLANT
PENCIL ELECTRO HAND CTR (MISCELLANEOUS) ×2 IMPLANT
POUCH SPECIMEN RETRIEVAL 10MM (ENDOMECHANICALS) ×2 IMPLANT
SEAL CANN UNIV 5-8 DVNC XI (MISCELLANEOUS) ×4 IMPLANT
SEAL XI 5MM-8MM UNIVERSAL (MISCELLANEOUS) ×4
SET TUBE SMOKE EVAC HIGH FLOW (TUBING) ×2 IMPLANT
SOLUTION ELECTROLUBE (MISCELLANEOUS) ×2 IMPLANT
SPONGE LAP 18X18 RF (DISPOSABLE) IMPLANT
SPONGE LAP 4X18 RFD (DISPOSABLE) ×2 IMPLANT
STAPLER CANNULA SEAL DVNC XI (STAPLE) ×1 IMPLANT
STAPLER CANNULA SEAL XI (STAPLE) ×1
SUT MNCRL AB 4-0 PS2 18 (SUTURE) ×2 IMPLANT
SUT VICRYL 0 AB UR-6 (SUTURE) ×4 IMPLANT
TAPE TRANSPORE STRL 2 31045 (GAUZE/BANDAGES/DRESSINGS) ×2 IMPLANT
TROCAR BALLN GELPORT 12X130M (ENDOMECHANICALS) ×2 IMPLANT

## 2020-01-08 NOTE — Anesthesia Preprocedure Evaluation (Signed)
Anesthesia Evaluation  Patient identified by MRN, date of birth, ID band Patient awake    Reviewed: Allergy & Precautions, NPO status , Patient's Chart, lab work & pertinent test results  History of Anesthesia Complications Negative for: history of anesthetic complications  Airway Mallampati: III       Dental   Pulmonary neg sleep apnea, neg COPD, Not current smoker,           Cardiovascular hypertension, Pt. on medications (-) Past MI and (-) CHF (-) dysrhythmias (-) Valvular Problems/Murmurs     Neuro/Psych neg Seizures    GI/Hepatic Neg liver ROS, GERD  Medicated,  Endo/Other  neg diabetes  Renal/GU negative Renal ROS     Musculoskeletal   Abdominal   Peds  Hematology   Anesthesia Other Findings   Reproductive/Obstetrics                             Anesthesia Physical Anesthesia Plan  ASA: II  Anesthesia Plan: General   Post-op Pain Management:    Induction: Intravenous  PONV Risk Score and Plan: 3 and Ondansetron, Dexamethasone and Treatment may vary due to age or medical condition  Airway Management Planned: Oral ETT  Additional Equipment:   Intra-op Plan:   Post-operative Plan:   Informed Consent: I have reviewed the patients History and Physical, chart, labs and discussed the procedure including the risks, benefits and alternatives for the proposed anesthesia with the patient or authorized representative who has indicated his/her understanding and acceptance.       Plan Discussed with:   Anesthesia Plan Comments:         Anesthesia Quick Evaluation

## 2020-01-08 NOTE — Transfer of Care (Signed)
Immediate Anesthesia Transfer of Care Note  Patient: Ashley Murrain  Procedure(s) Performed: XI ROBOTIC ASSISTED LAPAROSCOPIC CHOLECYSTECTOMY (N/A ) INDOCYANINE GREEN FLUORESCENCE IMAGING (ICG) (N/A )  Patient Location: PACU  Anesthesia Type:General  Level of Consciousness: drowsy and patient cooperative  Airway & Oxygen Therapy: Patient Spontanous Breathing and Patient connected to face mask oxygen  Post-op Assessment: Report given to RN and Post -op Vital signs reviewed and stable  Post vital signs: Reviewed and stable  Last Vitals:  Vitals Value Taken Time  BP 154/89 01/08/20 1556  Temp 36.1 C 01/08/20 1556  Pulse 82 01/08/20 1558  Resp 22 01/08/20 1558  SpO2 96 % 01/08/20 1558  Vitals shown include unvalidated device data.  Last Pain:  Vitals:   01/08/20 1556  TempSrc:   PainSc: Asleep         Complications: No complications documented.

## 2020-01-08 NOTE — Anesthesia Procedure Notes (Signed)
Procedure Name: Intubation Date/Time: 01/08/2020 1:50 PM Performed by: Nelda Marseille, CRNA Pre-anesthesia Checklist: Patient identified, Patient being monitored, Timeout performed, Emergency Drugs available and Suction available Patient Re-evaluated:Patient Re-evaluated prior to induction Oxygen Delivery Method: Circle system utilized Preoxygenation: Pre-oxygenation with 100% oxygen Induction Type: IV induction Ventilation: Mask ventilation without difficulty Laryngoscope Size: Mac, 3 and McGraph Grade View: Grade I Tube type: Oral Tube size: 7.0 mm Number of attempts: 1 Airway Equipment and Method: Stylet Placement Confirmation: ETT inserted through vocal cords under direct vision,  positive ETCO2 and breath sounds checked- equal and bilateral Secured at: 21 cm Tube secured with: Tape Dental Injury: Teeth and Oropharynx as per pre-operative assessment

## 2020-01-08 NOTE — Op Note (Signed)
  Procedure Date:  01/08/2020  Pre-operative Diagnosis:   Symptomatic cholelithiasis  Post-operative Diagnosis:  Symptomatic cholelithiasis  Procedure:  Robotic assisted cholecystectomy with ICG FireFly cholangiogram  Surgeon:  Melvyn Neth, MD  Anesthesia:  General endotracheal  Estimated Blood Loss:  10 ml  Specimens:  gallbladder  Complications:  None  Indications for Procedure:  This is a 64 y.o. female who presents with abdominal pain and workup revealing symptomatic cholelithiasis.  The benefits, complications, treatment options, and expected outcomes were discussed with the patient. The risks of bleeding, infection, recurrence of symptoms, failure to resolve symptoms, bile duct damage, bile duct leak, retained common bile duct stone, bowel injury, and need for further procedures were all discussed with the patient and she was willing to proceed.  Description of Procedure: The patient was correctly identified in the preoperative area and brought into the operating room.  The patient was placed supine with VTE prophylaxis in place.  Appropriate time-outs were performed.  Anesthesia was induced and the patient was intubated.  Appropriate antibiotics were infused.  The abdomen was prepped and draped in a sterile fashion. An infraumbilical incision was made. A cutdown technique was used to enter the abdominal cavity without injury, and a 12 mm robotic port was inserted.  Pneumoperitoneum was obtained with appropriate opening pressures.  Three 8-mm ports were placed in the mid abdomen at the level of the umbilicus under direct visualization.  There were previous adhesions from the omentum to the right side of the abdominal wall which were interfering with the location of the rightmost lateral port.  These adhesions were taken down using the laparoscopic cautery.  The DaVinci platform was docked, camera targeted, and instruments were placed under direct visualization.    The gallbladder  was identified.  The fundus was grasped and retracted cephalad.  Adhesions were lysed bluntly and with electrocautery. The infundibulum was grasped and retracted laterally, exposing the peritoneum overlying the gallbladder.  This was incised with electrocautery and extended on either side of the gallbladder.  FireFly cholangiogram was then obtained, and we were able to clearly identify the cystic duct and common bile duct.  The cystic duct and cystic artery were carefully dissected with combination of cautery and blunt dissection.  Both were clipped twice proximally and once distally, cutting in between.  The gallbladder was taken from the gallbladder fossa in a retrograde fashion with electrocautery. The gallbladder was placed in an Endocatch bag. The liver bed was inspected and any bleeding was controlled with electrocautery. The right upper quadrant was then inspected again revealing intact clips, no bleeding, and no ductal injury.  The 8 mm ports were removed under direct visualization and the 12 mm port was removed.  The Endocatch bag was brought out via the umbilical incision. The fascial opening was closed using 0 vicryl suture.  Local anesthetic was infused in all incisions and the incisions were closed with 4-0 Monocryl.  The wounds were cleaned and sealed with DermaBond.  The patient was emerged from anesthesia and extubated and brought to the recovery room for further management.  The patient tolerated the procedure well and all counts were correct at the end of the case.   Melvyn Neth, MD

## 2020-01-08 NOTE — Discharge Instructions (Signed)
AMBULATORY SURGERY  DISCHARGE INSTRUCTIONS   1) The drugs that you were given will stay in your system until tomorrow so for the next 24 hours you should not:  A) Drive an automobile B) Make any legal decisions C) Drink any alcoholic beverage   2) You may resume regular meals tomorrow.  Today it is better to start with liquids and gradually work up to solid foods.  You may eat anything you prefer, but it is better to start with liquids, then soup and crackers, and gradually work up to solid foods.   3) Please notify your doctor immediately if you have any unusual bleeding, trouble breathing, redness and pain at the surgery site, drainage, fever, or pain not relieved by medication.    4) Additional Instructions:        Please contact your physician with any problems or Same Day Surgery at [phone], Monday through Friday 6 am to 4 pm, or [place] at [facility] number at [phone].   Laparoscopic Cholecystectomy, Care After This sheet gives you information about how to care for yourself after your procedure. Your doctor may also give you more specific instructions. If you have problems or questions, contact your doctor. Follow these instructions at home: Care for cuts from surgery (incisions)   Follow instructions from your doctor about how to take care of your cuts from surgery. Make sure you: ? Wash your hands with soap and water before you change your bandage (dressing). If you cannot use soap and water, use hand sanitizer. ? Change your bandage as told by your doctor. ? Leave stitches (sutures), skin glue, or skin tape (adhesive) strips in place. They may need to stay in place for 2 weeks or longer. If tape strips get loose and curl up, you may trim the loose edges. Do not remove tape strips completely unless your doctor says it is okay.  Do not take baths, swim, or use a hot tub until your doctor says it is okay. Ask your doctor if you can take showers. You  may only be allowed to take sponge baths for bathing.  Check your surgical cut area every day for signs of infection. Check for: ? More redness, swelling, or pain. ? More fluid or blood. ? Warmth. ? Pus or a bad smell. Activity  Do not drive or use heavy machinery while taking prescription pain medicine.  Do not lift anything that is heavier than 10 lb (4.5 kg) until your doctor says it is okay.  Do not play contact sports until your doctor says it is okay.  Do not drive for 24 hours if you were given a medicine to help you relax (sedative).  Rest as needed. Do not return to work or school until your doctor says it is okay. General instructions  Take over-the-counter and prescription medicines only as told by your doctor.  To prevent or treat constipation while you are taking prescription pain medicine, your doctor may recommend that you: ? Drink enough fluid to keep your pee (urine) clear or pale yellow. ? Take over-the-counter or prescription medicines. ? Eat foods that are high in fiber, such as fresh fruits and vegetables, whole grains, and beans. ? Limit foods that are high in fat and processed sugars, such as fried and sweet foods. Contact a doctor if:  You develop a rash.  You have more redness, swelling, or pain around your surgical cuts.  You have more fluid or blood coming from your surgical cuts.  Your  surgical cuts feel warm to the touch.  You have pus or a bad smell coming from your surgical cuts.  You have a fever.  One or more of your surgical cuts breaks open. Get help right away if:  You have trouble breathing.  You have chest pain.  You have pain that is getting worse in your shoulders.  You faint or feel dizzy when you stand.  You have very bad pain in your belly (abdomen).  You are sick to your stomach (nauseous) for more than one day.  You have throwing up (vomiting) that lasts for more than one day.  You have leg pain. This information  is not intended to replace advice given to you by your health care provider. Make sure you discuss any questions you have with your health care provider. Document Revised: 12/31/2016 Document Reviewed: 07/07/2015 Elsevier Patient Education  [place].

## 2020-01-08 NOTE — Interval H&P Note (Signed)
History and Physical Interval Note:  01/08/2020 1:18 PM  Karen Boyd  has presented today for surgery, with the diagnosis of symptomatic cholelithiasis.  The various methods of treatment have been discussed with the patient and family. After consideration of risks, benefits and other options for treatment, the patient has consented to  Procedure(s): XI ROBOTIC ASSISTED LAPAROSCOPIC CHOLECYSTECTOMY (N/A) [place] (ICG) (N/A) as a surgical intervention.  The patient's history has been reviewed, patient examined, no change in status, stable for surgery.  I have reviewed the patient's chart and labs.  Questions were answered to the patient's satisfaction.      

## 2020-01-08 NOTE — Anesthesia Postprocedure Evaluation (Signed)
Anesthesia Post Note  [name]  Procedure(s) Performed: XI ROBOTIC ASSISTED LAPAROSCOPIC CHOLECYSTECTOMY (N/A ) INDOCYANINE GREEN FLUORESCENCE IMAGING (ICG) (N/A )  Patient location during evaluation: PACU Anesthesia Type: General Level of consciousness: awake and alert Pain management: pain level controlled Vital Signs Assessment: post-procedure vital signs reviewed and stable Respiratory status: spontaneous breathing, nonlabored ventilation and respiratory function stable Cardiovascular status: blood pressure returned to baseline and stable Postop Assessment: no apparent nausea or vomiting Anesthetic complications: no   No complications documented.   Last Vitals:  Vitals:   01/08/20 1626 01/08/20 1659  BP: 136/72 134/66  Pulse: 84 88  Resp: (!) 9 18  Temp:  (!) 36.4 C  SpO2: 97% 97%    Last Pain:  Vitals:   01/08/20 1659  TempSrc:   PainSc: [place]

## 2020-01-10 LAB — SURGICAL PATHOLOGY

## 2020-01-11 ENCOUNTER — Telehealth: Payer: Self-pay | Admitting: *Deleted

## 2020-01-11 NOTE — Telephone Encounter (Signed)
Faxed FMLA to [employer] at [phone]

## 2020-01-19 IMAGING — CR FACIAL BONES COMPLETE 3+V
3 series · 3 of 3 positions shown · non-contrast
Comparison: None.

CLINICAL DATA: Facial injury after fall today.

EXAM:
FACIAL BONES COMPLETE 3+V

[w waters pa]
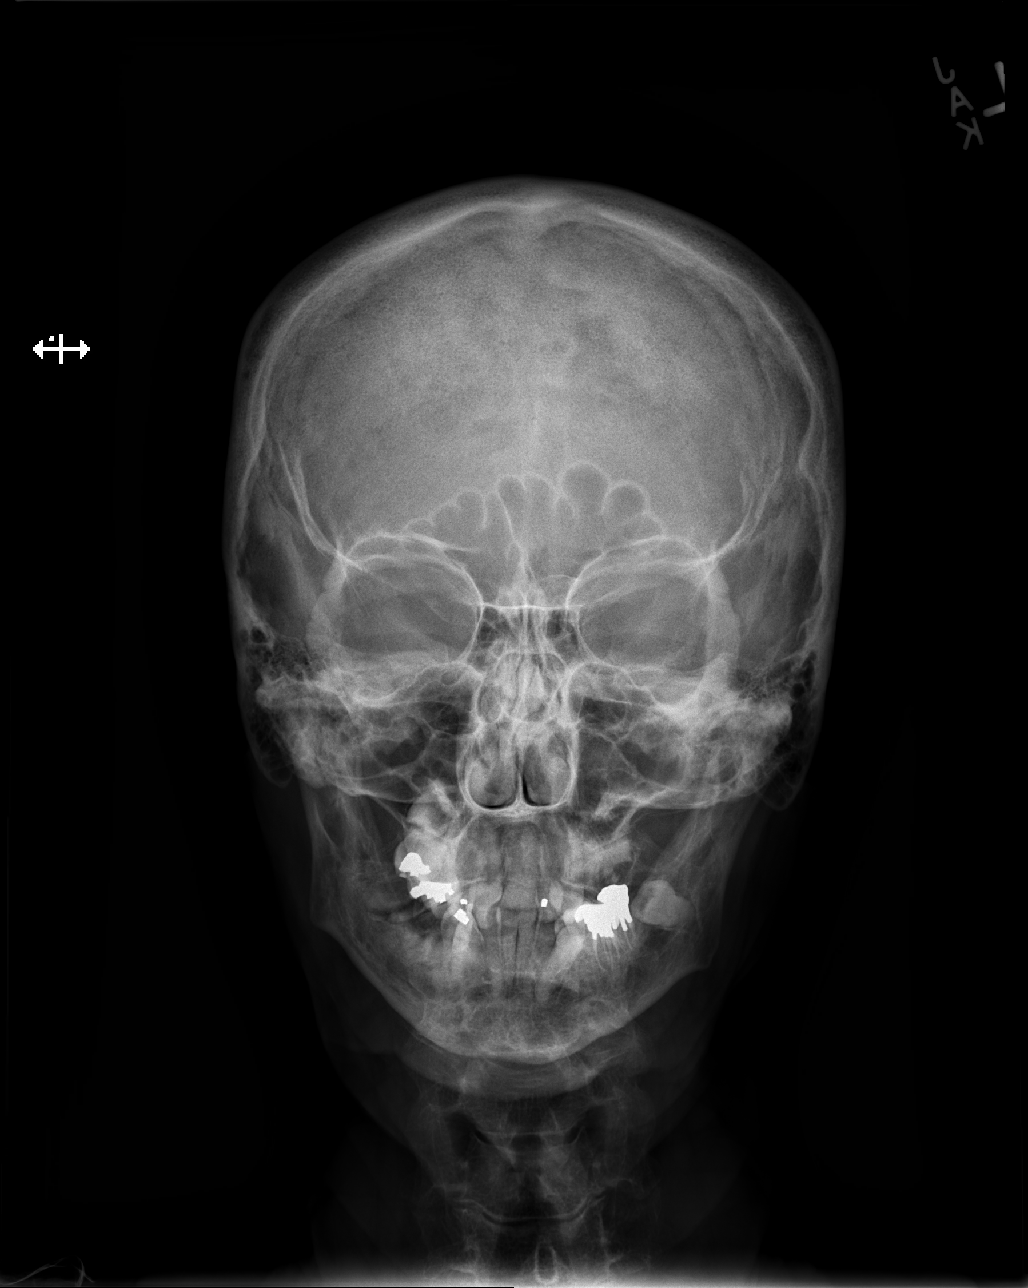

[[person_name] pa]
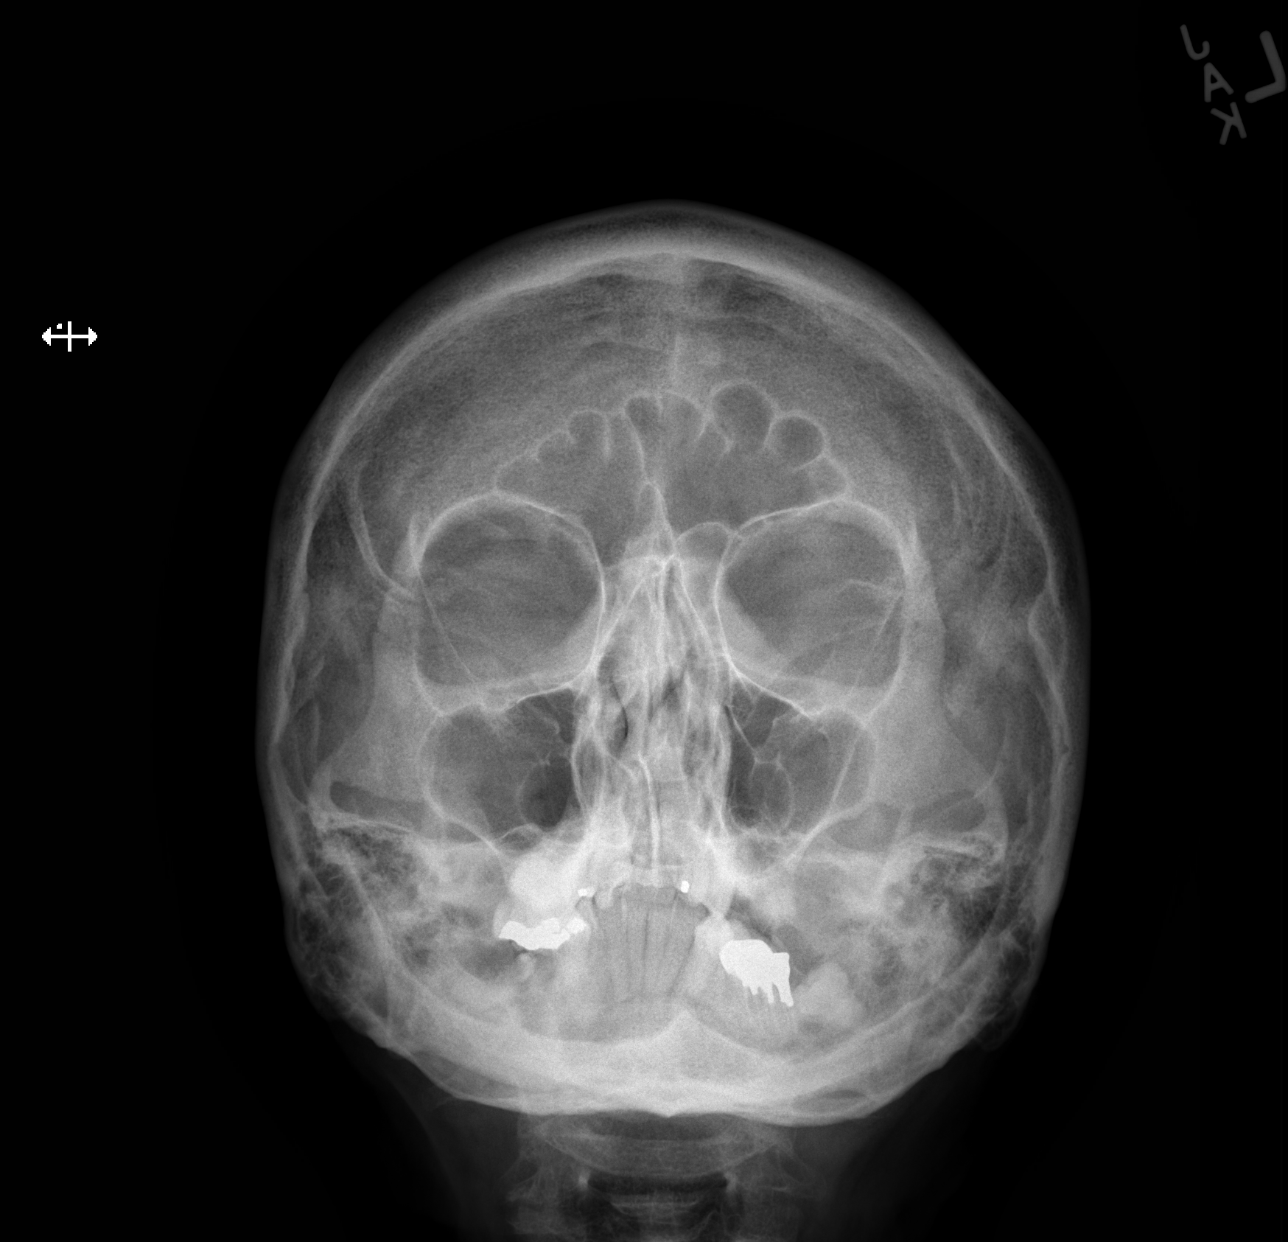

[w skull lat]
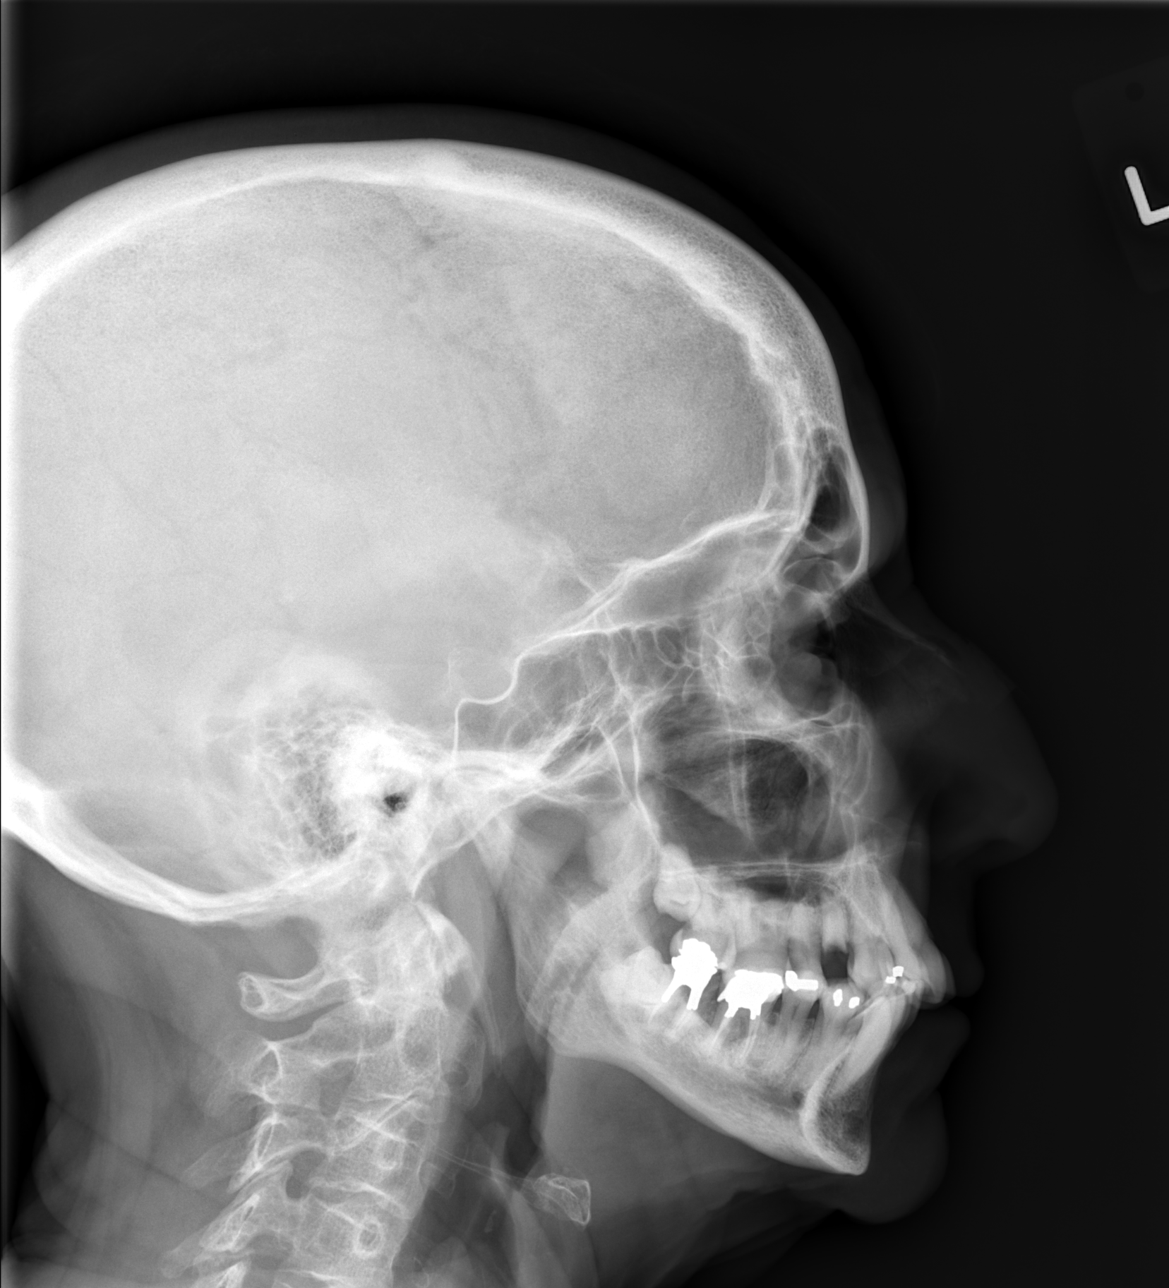

[3 of 3 positions shown; findings below may reference images not displayed]

FINDINGS: There is no evidence of fracture or other significant bone
abnormality. No orbital emphysema or sinus air-fluid levels are
seen.
IMPRESSION: Negative.

## 2020-01-22 ENCOUNTER — Other Ambulatory Visit: Payer: Self-pay

## 2020-01-22 ENCOUNTER — Ambulatory Visit (INDEPENDENT_AMBULATORY_CARE_PROVIDER_SITE_OTHER): Payer: 59 | Admitting: Physician Assistant

## 2020-01-22 ENCOUNTER — Encounter: Payer: Self-pay | Admitting: Physician Assistant

## 2020-01-22 VITALS — BP 111/63 | HR 100 | Temp 98.1°F | Ht 65.0 in | Wt 186.0 lb

## 2020-01-22 DIAGNOSIS — K8020 Calculus of gallbladder without cholecystitis without obstruction: Secondary | ICD-10-CM

## 2020-01-22 DIAGNOSIS — Z09 Encounter for follow-up examination after completed treatment for conditions other than malignant neoplasm: Secondary | ICD-10-CM

## 2020-01-22 NOTE — Patient Instructions (Addendum)
GENERAL POST-OPERATIVE PATIENT INSTRUCTIONS   WOUND CARE INSTRUCTIONS:   Try to keep the wound dry and avoid ointments on the wound unless directed to do so.  If the wound becomes bright red and painful or starts to drain infected material that is not clear, please contact your physician immediately.  If the wound is mildly pink and has a thick firm ridge underneath it, this is normal, and is referred to as a healing ridge.  This will resolve over the next 4-6 weeks.  BATHING: You may shower if you have been informed of this by your surgeon. However, Please do not submerge in a tub, hot tub, or pool until incisions are completely sealed or have been told by your surgeon that you may do so.  DIET:  You may eat any foods that you can tolerate.  It is a good idea to eat a high fiber diet and take in plenty of fluids to prevent constipation.  If you do become constipated you may want to take a mild laxative or take ducolax tablets on a daily basis until your bowel habits are regular.  Constipation can be very uncomfortable, along with straining, after recent surgery.  ACTIVITY:  You are encouraged to cough and deep breath or use your incentive spirometer if you were given one, every 15-30 minutes when awake.  This will help prevent respiratory complications and low grade fevers post-operatively if you had a general anesthetic.  You may want to hug a pillow when coughing and sneezing to add additional support to the surgical area, if you had abdominal or chest surgery, which will decrease pain during these times.  You are encouraged to walk and engage in light activity for the next two weeks.  You should not lift more than 20 pounds for 4 weeks after surgery as it could put you at increased risk for complications.  Twenty pounds is roughly equivalent to a plastic bag of groceries. At that time- Listen to your body when lifting, if you have pain when lifting, stop and then try again in a few days. Soreness after  doing exercises or activities of daily living is normal as you get back in to your normal routine.  MEDICATIONS:  Try to take narcotic medications and anti-inflammatory medications, such as tylenol, ibuprofen, naprosyn, etc., with food.  This will minimize stomach upset from the medication.  Should you develop nausea and vomiting from the pain medication, or develop a rash, please discontinue the medication and contact your physician.  You should not drive, make important decisions, or operate machinery when taking narcotic pain medication.  SUNBLOCK Use sun block to incision area over the next year if this area will be exposed to sun. This helps decrease scarring and will allow you avoid a permanent darkened area over your incision.  QUESTIONS:  Please feel free to call our office if you have any questions, and we will be glad to assist you.

## 2020-01-22 NOTE — Progress Notes (Signed)
[facility] POST-OP OFFICE VISIT  01/22/2020  HPI: Karen Boyd is a 64 y.o. female 14 days s/p Robotic assisted cholecystectomy with ICG FireFly cholangiogram  She is doing very well No issues with pain, fever, nausea, diarrhea Tolerating PO No issues with incisions  Vital signs: BP 111/63   Pulse 100   Temp 98.1 F (36.7 C)   Ht 5\' 5"  (1.651 m)   Wt 186 lb (84.4 kg)   SpO2 98%   BMI 30.95 kg/m    Physical Exam: Constitutional: Well appearing female, NAD Abdomen: Soft, non-tender, non-distended, no rebound/guarding Skin: Laparoscopic incisions are well healed, no erythema or drianage  Assessment/Plan: This is a 64 y.o. female 14 days s/p Robotic assisted cholecystectomy with ICG FireFly cholangiogram   - Pain control prn  - reviewed wound care  - Reviewed lifting instructions; work note provided  - reviewed pathology; [place], negative for malignancy  - rtc prn   -- Edison Simon, PA-C [facility] 01/22/2020, 10:30 AM [phone] M-F: 7am - 4pm

## 2020-04-18 ENCOUNTER — Other Ambulatory Visit: Payer: Self-pay | Admitting: Nurse Practitioner

## 2020-04-18 ENCOUNTER — Ambulatory Visit
Admission: RE | Admit: 2020-04-18 | Discharge: 2020-04-18 | Disposition: A | Discharge status: Home / Self Care | Payer: No Typology Code available for payment source | Source: Ambulatory Visit | Attending: Nurse Practitioner | Admitting: Nurse Practitioner

## 2020-04-18 ENCOUNTER — Other Ambulatory Visit: Payer: Self-pay

## 2020-04-18 DIAGNOSIS — S79912A Unspecified injury of left hip, initial encounter: Secondary | ICD-10-CM

## 2020-05-16 DIAGNOSIS — M25552 Pain in left hip: Secondary | ICD-10-CM | POA: Insufficient documentation

## 2020-06-23 DIAGNOSIS — F40240 Claustrophobia: Secondary | ICD-10-CM | POA: Insufficient documentation

## 2020-08-16 IMAGING — CR DG SHOULDER 2+V*L*
3 series · 3 of 3 positions shown · non-contrast
Comparison: None.

CLINICAL DATA: Acute left shoulder pain after fall.

EXAM:
LEFT SHOULDER - 2+ VIEW

[w shoulder grashey left]
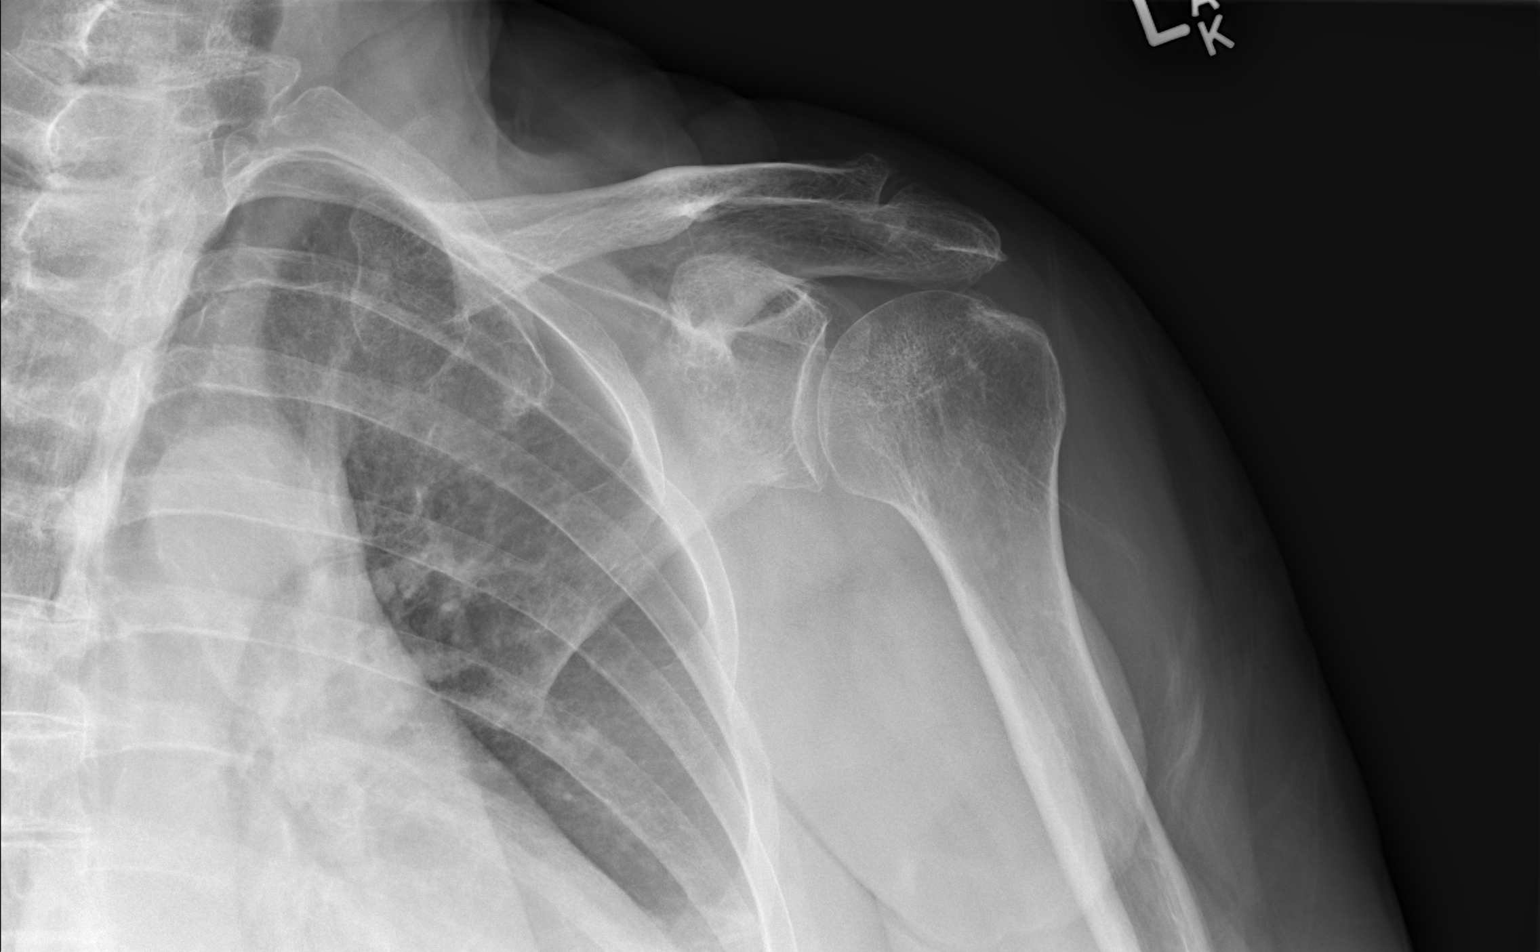

[w shoulder y-view left]
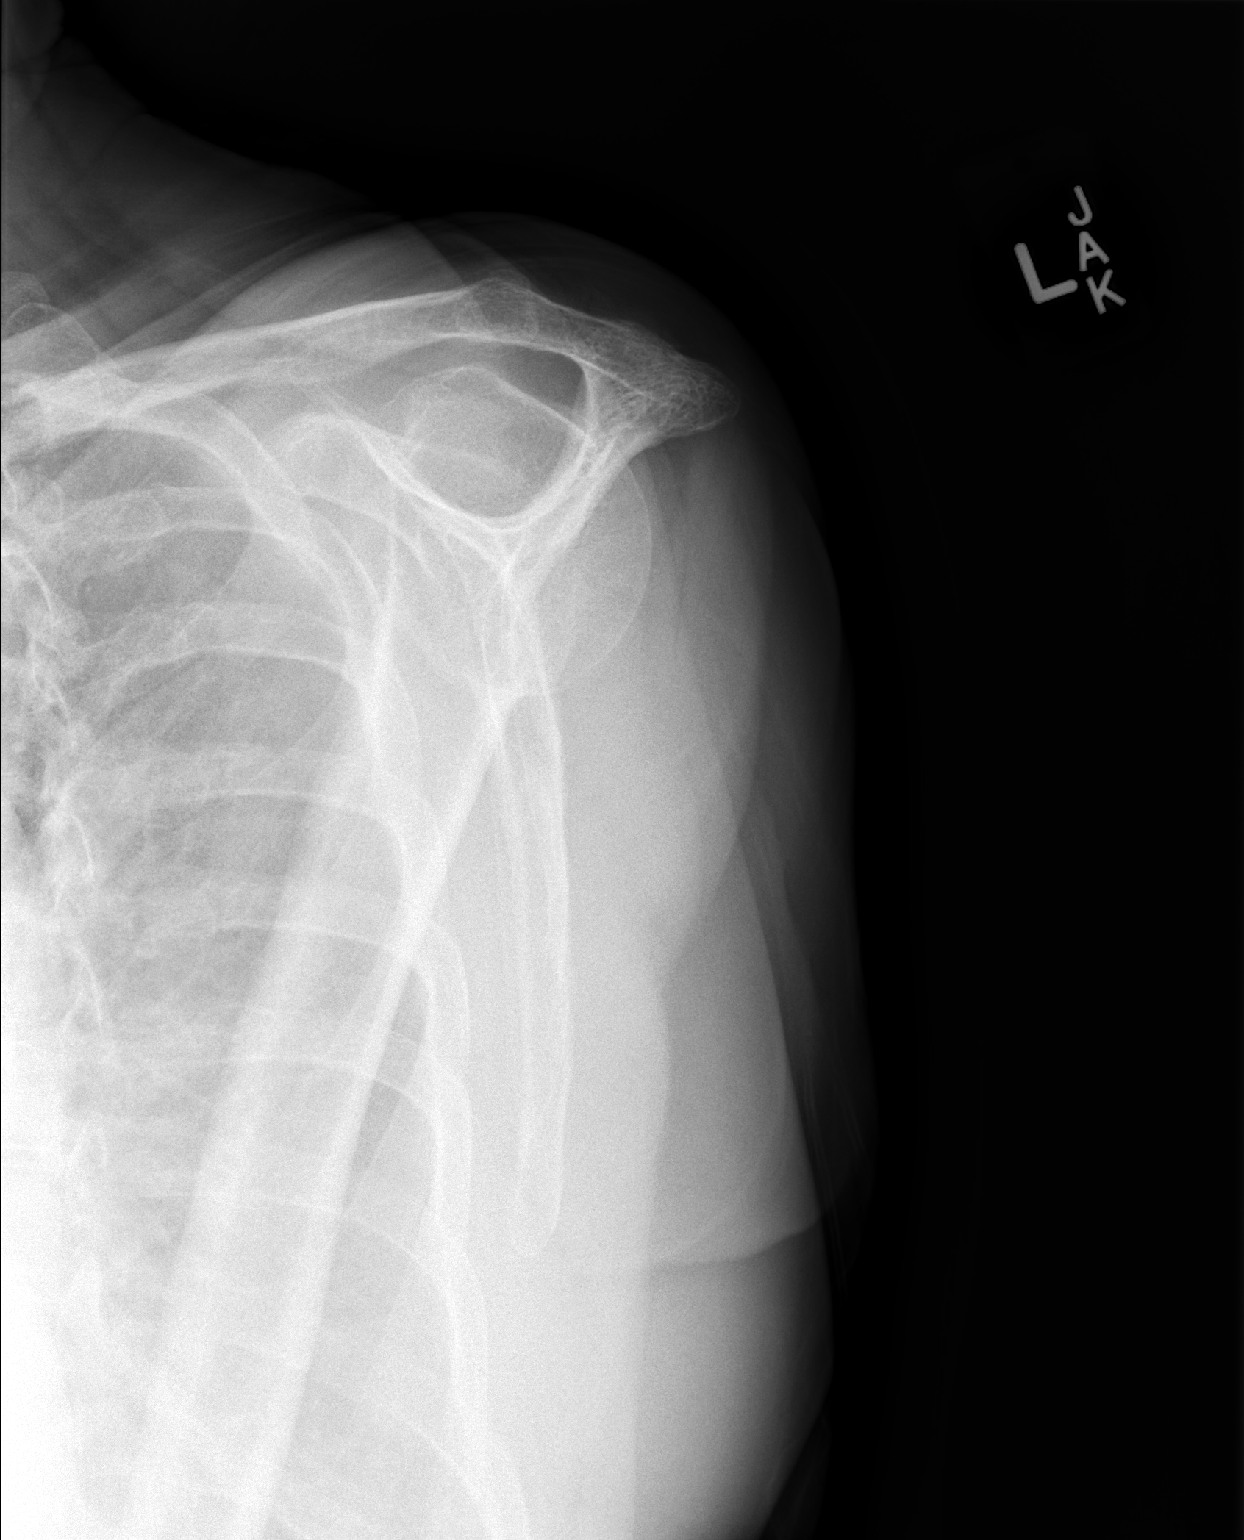

[w shoulder axillary left]
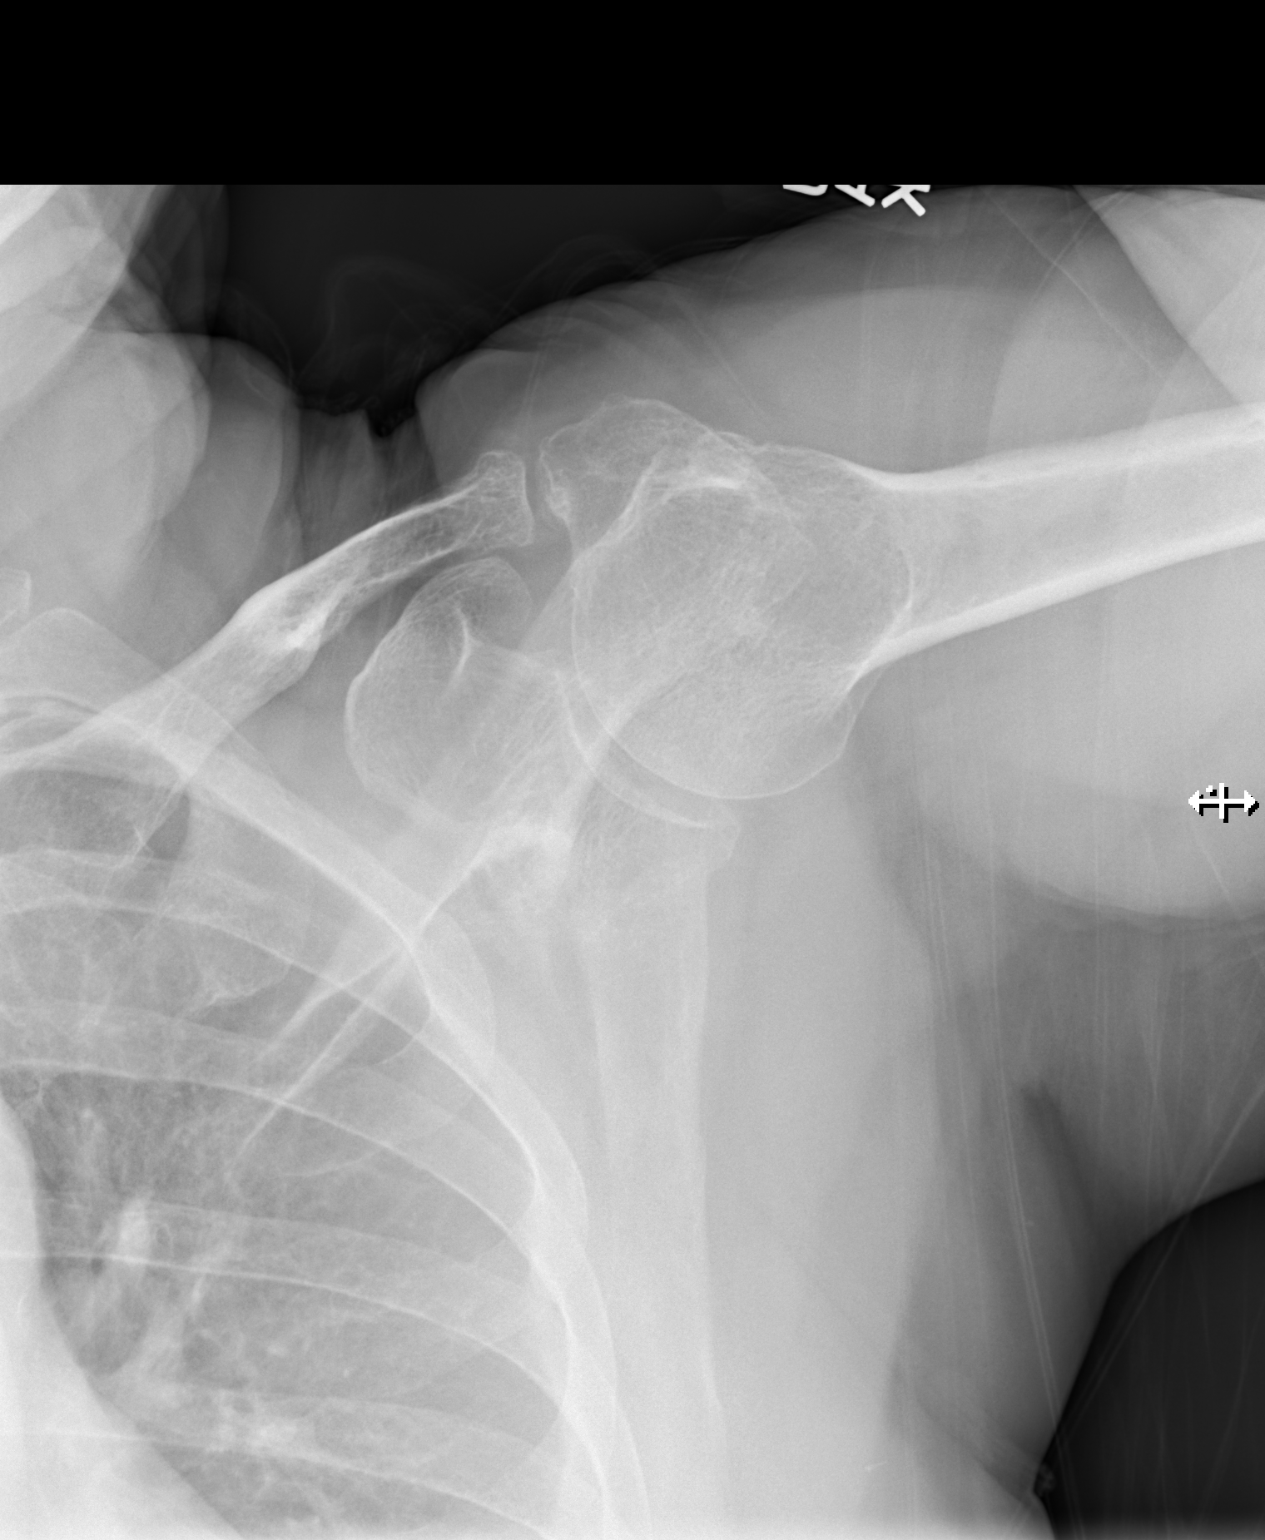

[3 of 3 positions shown; findings below may reference images not displayed]

FINDINGS: There is no evidence of fracture or dislocation. There is no
evidence of arthropathy or other focal bone abnormality. Soft
tissues are unremarkable.
IMPRESSION: Negative.

## 2020-09-08 DIAGNOSIS — S73102A Unspecified sprain of left hip, initial encounter: Secondary | ICD-10-CM | POA: Insufficient documentation

## 2020-09-13 ENCOUNTER — Other Ambulatory Visit: Payer: Self-pay | Admitting: Family Medicine

## 2020-09-13 DIAGNOSIS — I10 Essential (primary) hypertension: Secondary | ICD-10-CM

## 2020-09-13 NOTE — Telephone Encounter (Signed)
Requested medications are due for refill today yes  Requested medications are on the active medication list yes  Last refill 7/16  Last visit 11/2019  Future visit scheduled no  Notes to clinic Failed protocol due to no valid visit within 6  months, no upcoming appt scheduled.

## 2020-10-09 ENCOUNTER — Other Ambulatory Visit: Payer: Self-pay | Admitting: Family Medicine

## 2020-10-09 DIAGNOSIS — I10 Essential (primary) hypertension: Secondary | ICD-10-CM

## 2020-10-10 NOTE — Telephone Encounter (Signed)
Requested medications are due for refill today yes  Requested medications are on the active medication list yes  Last refill 09/15/20  Last visit 11/30/19  Future visit scheduled no  Notes to clinic Has already had a curtesy refill and there is no upcoming appointment scheduled.

## 2020-10-16 ENCOUNTER — Encounter: Payer: Self-pay | Admitting: Family Medicine

## 2020-10-16 ENCOUNTER — Other Ambulatory Visit: Payer: Self-pay

## 2020-10-16 ENCOUNTER — Ambulatory Visit (INDEPENDENT_AMBULATORY_CARE_PROVIDER_SITE_OTHER): Payer: 59 | Admitting: Family Medicine

## 2020-10-16 VITALS — BP 165/91 | HR 73 | Temp 98.1°F | Resp 16 | Ht 65.0 in | Wt 180.0 lb

## 2020-10-16 DIAGNOSIS — I10 Essential (primary) hypertension: Secondary | ICD-10-CM

## 2020-10-16 MED ORDER — LISINOPRIL 20 MG PO TABS: 20.0000 mg | ORAL_TABLET | Freq: Every day | ORAL | 1 refills | Status: DC

## 2020-10-16 NOTE — Progress Notes (Signed)
I,April Miller,acting as a scribe for Wilhemena Durie, MD.,have documented all relevant documentation on the behalf of Wilhemena Durie, MD,as directed by  Wilhemena Durie, MD while in the presence of Wilhemena Durie, MD.   Established patient visit   [name]   DOB: 09-Mar-1955   65 y.o. Female  MRN: [id number] Visit Date: 10/16/2020  Today's healthcare provider: Wilhemena Durie, MD   Chief Complaint  Patient presents with   Follow-up   Hypertension   Subjective    HPI  Patient lives in [place] and is single with no children.  She is a non-smoker and nondrinker.  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  Strenuous job but she states she is able to do it at 64.  She has no complaints today.  She states her blood pressures at home runs 130s over 80s Hypertension, follow-up  BP Readings from Last 3 Encounters:  10/16/20 (!) 165/91  01/22/20 111/63  01/08/20 134/66   Wt Readings from Last 3 Encounters:  10/16/20 180 lb (81.6 kg)  01/22/20 186 lb (84.4 kg)  01/08/20 188 lb (85.3 kg)     She was last seen for hypertension 1 years ago by [employer], PA-C.  BP at that visit was 134/66. Management since that visit includes no medication changes. On Lisinopril '10mg'$ .   She reports good compliance with treatment. She is not having side effects. none She is following a Regular diet. She is exercising. She does not smoke.  Use of agents associated with hypertension: none.   Outside blood pressures are 130/84-135/88.  Pertinent labs: Lab Results  Component Value Date   CHOL 181 03/22/2018   HDL 60 03/22/2018   LDLCALC 111 (H) 03/22/2018   TRIG 52 03/22/2018   CHOLHDL 3.0 03/22/2018   Lab Results  Component Value Date   NA 139 11/30/2019   K 4.5 11/30/2019   CREATININE 0.73 11/30/2019   GFRNONAA 89 11/30/2019   GFRAA 103 11/30/2019   GLUCOSE 86 11/30/2019     The 10-year ASCVD risk score (Arnett  DK, et al., 2019) is: 8%   ---------------------------------------------------------------------------------------------------     Medications: Outpatient Medications Prior to Visit  Medication Sig   acetaminophen (TYLENOL) 500 MG tablet Take 500-1,000 mg by mouth daily as needed for moderate pain or headache.   Olopatadine HCl (PATADAY OP) Place 1 drop into both eyes daily.   [DISCONTINUED] lisinopril (ZESTRIL) 10 MG tablet TAKE 1 TABLET BY MOUTH EVERY DAY   No facility-administered medications prior to visit.    Review of Systems  Constitutional:  Negative for activity change.  Respiratory:  Negative for cough and shortness of breath.   Cardiovascular:  Negative for chest pain, palpitations and leg swelling.  Musculoskeletal:  Negative for arthralgias, joint swelling and myalgias.  Neurological:  Negative for dizziness, light-headedness and headaches.  Psychiatric/Behavioral:  Negative for self-injury, sleep disturbance and suicidal ideas. The patient is not nervous/anxious.        Objective    BP (!) 165/91 (BP Location: Left Arm, Patient Position: Sitting, Cuff Size: Large)   Pulse 73   Temp 98.1 F (36.7 C) (Oral)   Resp 16   Ht '5\' 5"'$  (1.651 m)   Wt 180 lb (81.6 kg)   SpO2 95%   BMI 29.95 kg/m  BP Readings from Last 3 Encounters:  10/16/20 (!) 165/91  01/22/20 111/63  01/08/20 134/66   Wt Readings from Last 3 Encounters:  10/16/20 180 lb (81.6 kg)  01/22/20 186 lb (84.4 kg)  01/08/20 188 lb (85.3 kg)      Physical Exam Vitals reviewed.  Constitutional:      General: She is not in acute distress.    Appearance: She is well-developed.  HENT:     Head: Normocephalic and atraumatic.     Right Ear: Hearing normal.     Left Ear: Hearing normal.     Nose: Nose normal.  Eyes:     General: Lids are normal. No scleral icterus.       Right eye: No discharge.        Left eye: No discharge.     Conjunctiva/sclera: Conjunctivae normal.  Cardiovascular:      Rate and Rhythm: Normal rate and regular rhythm.     Heart sounds: Normal heart sounds.  Pulmonary:     Effort: Pulmonary effort is normal. No respiratory distress.  Musculoskeletal:        General: Normal range of motion.     Cervical back: Normal range of motion and neck supple.  Skin:    Findings: No lesion or rash.  Neurological:     Mental Status: She is alert and oriented to person, place, and time.  Psychiatric:        Speech: Speech normal.        Behavior: Behavior normal.        Thought Content: Thought content normal.      No results found for any visits on 10/16/20.  Assessment & Plan     1. Essential (primary) hypertension Increased lisinopril from 10 mg to 20 mg qd.  Follow-up in 6 months for physical. - lisinopril (ZESTRIL) 20 MG tablet; Take 1 tablet (20 mg total) by mouth daily.  Dispense: 90 tablet; Refill: 1   Return in about 6 months (around 04/15/2021).      I, Wilhemena Durie, MD, have reviewed all documentation for this visit. The documentation on 10/20/20 for the exam, diagnosis, procedures, and orders are all accurate and complete.     Cranford Mon, MD  [facility] [phone] (phone) [phone] (fax)  [place]

## 2021-03-19 ENCOUNTER — Other Ambulatory Visit: Payer: Self-pay | Admitting: Family Medicine

## 2021-03-19 DIAGNOSIS — I10 Essential (primary) hypertension: Secondary | ICD-10-CM

## 2021-04-22 ENCOUNTER — Encounter: Payer: 59 | Admitting: Family Medicine

## 2021-05-27 ENCOUNTER — Encounter: Payer: 59 | Admitting: Physician Assistant

## 2021-07-01 ENCOUNTER — Encounter: Payer: 59 | Admitting: Physician Assistant

## 2021-07-07 NOTE — Progress Notes (Deleted)
I,Sha'taria ,acting as a Education administrator for [employer], PA-C.,have documented all relevant documentation on the behalf of Karen Kirschner, PA-C,as directed by  Karen Kirschner, PA-C while in the presence of Karen Kirschner, PA-C.   Complete physical exam   [name]   DOB: 1955-03-25   66 y.o. Female  MRN: [id number] Visit Date: 07/08/2021  Today's healthcare provider: [name], PA-C   No chief complaint on file.  Subjective    Karen Boyd is a 66 y.o. female who presents today for a complete physical exam.  She reports consuming a {diet types:17450} diet. {Exercise:19826} She generally feels {well/fairly well/poorly:18703}. She reports sleeping {well/fairly well/poorly:18703}. She {does/does not:200015} have additional problems to discuss today.  HPI  ***  Past Medical History:  Diagnosis Date   GERD (gastroesophageal reflux disease)    Hypertension    Past Surgical History:  Procedure Laterality Date   ANKLE SURGERY     APPENDECTOMY     cataract surgery x3  2020-2021   2 in 2020 and 1 in 2021   ELBOW SURGERY Left    TOTAL ABDOMINAL HYSTERECTOMY W/ BILATERAL SALPINGOOPHORECTOMY  08/16/2001   large 18 cm leiomyoma   Social History   Socioeconomic History   Marital status: Single    Spouse name: Not on file   Number of children: Not on file   Years of education: Not on file   Highest education level: Not on file  Occupational History   Not on file  Tobacco Use   Smoking status: Never   Smokeless tobacco: Never  Vaping Use   Vaping Use: Never used  Substance and Sexual Activity   Alcohol use: No    Alcohol/week: 0.0 standard drinks   Drug use: No   Sexual activity: Not on file  Other Topics Concern   Not on file  Social History Narrative   Not on file   Social Determinants of Health   Financial Resource Strain: Not on file  Food Insecurity: Not on file  Transportation Needs: Not on file  Physical Activity: Not on file   Stress: Not on file  Social Connections: Not on file  Intimate Partner Violence: Not on file   Family Status  Relation Name Status   Father  Deceased at age 60   MGM  Deceased   PGF  Deceased at age 73   MGF  Deceased   PGM  Deceased   Brother  Insurance risk surveyor   Mother  (Not Specified)   Family History  Problem Relation Age of Onset   Heart failure Father    Ovarian cancer Maternal Grandmother    Cirrhosis Paternal Grandfather    Hypertension Mother    Diabetes Mother    Gout Mother    Allergies  Allergen Reactions   Shellfish Allergy Anaphylaxis    Patient Care Team: Jerrol Banana., MD as PCP - General (Family Medicine)   Medications: Outpatient Medications Prior to Visit  Medication Sig   acetaminophen (TYLENOL) 500 MG tablet Take 500-1,000 mg by mouth daily as needed for moderate pain or headache.   lisinopril (ZESTRIL) 20 MG tablet TAKE 1 TABLET BY MOUTH EVERY DAY   Olopatadine HCl (PATADAY OP) Place 1 drop into both eyes daily.   No facility-administered medications prior to visit.    Review of Systems  {Labs  Heme  Chem  Endocrine  Serology  Results Review (optional):23779}  Objective    There were no vitals taken for  this visit. {Show previous vital signs (optional):23777}   Physical Exam  ***  Last depression screening scores    11/30/2019   10:04 AM 10/04/2017    4:29 PM 05/27/2016    8:43 AM  PHQ 2/9 Scores  PHQ - 2 Score 0 0 0  PHQ- 9 Score  6 0   Last fall risk screening    12/26/2019   10:36 AM  Fall Risk   Falls in the past year? 0   Last Audit-C alcohol use screening    11/30/2019   10:03 AM  Alcohol Use Disorder Test (AUDIT)  1. How often do you have a drink containing alcohol? 0  2. How many drinks containing alcohol do you have on a typical day when you are drinking? 0  3. How often do you have six or more drinks on one occasion? 0  AUDIT-C Score 0  Alcohol Brief Interventions/Follow-up AUDIT Score <7  follow-up not indicated   A score of 3 or more in women, and 4 or more in men indicates increased risk for alcohol abuse, EXCEPT if all of the points are from question 1   No results found for any visits on 07/08/21.  Assessment & Plan    Routine Health Maintenance and Physical Exam  Exercise Activities and Dietary recommendations  Goals   None      There is no immunization history on file for this patient.  Health Maintenance  Topic Date Due   HIV Screening  Never done   Hepatitis C Screening  Never done   TETANUS/TDAP  Never done   PAP SMEAR-Modifier  Never done   MAMMOGRAM  Never done   Zoster Vaccines- Shingrix (1 of 2) Never done   COLONOSCOPY (Pts 45-22yr Insurance coverage will need to be confirmed)  10/24/2011   INFLUENZA VACCINE  09/01/2021   HPV VACCINES  Aged Out    Discussed health benefits of physical activity, and encouraged her to engage in regular exercise appropriate for her age and condition.  ***  No follow-ups on file.     {provider attestation***:1}   LMikey Kirschner PA-C  [phone](phone) [phone](fax)  [place]

## 2021-07-08 ENCOUNTER — Other Ambulatory Visit: Payer: Self-pay

## 2021-07-08 ENCOUNTER — Encounter: Payer: Self-pay | Admitting: Physician Assistant

## 2021-07-08 ENCOUNTER — Telehealth: Payer: Self-pay

## 2021-07-08 ENCOUNTER — Ambulatory Visit (INDEPENDENT_AMBULATORY_CARE_PROVIDER_SITE_OTHER): Payer: 59 | Admitting: Physician Assistant

## 2021-07-08 VITALS — BP 134/82 | HR 77 | Ht 65.0 in | Wt 171.5 lb

## 2021-07-08 DIAGNOSIS — I10 Essential (primary) hypertension: Secondary | ICD-10-CM

## 2021-07-08 DIAGNOSIS — Z1159 Encounter for screening for other viral diseases: Secondary | ICD-10-CM

## 2021-07-08 DIAGNOSIS — Z1211 Encounter for screening for malignant neoplasm of colon: Secondary | ICD-10-CM

## 2021-07-08 DIAGNOSIS — Z23 Encounter for immunization: Secondary | ICD-10-CM | POA: Diagnosis not present

## 2021-07-08 DIAGNOSIS — Z0000 Encounter for general adult medical examination without abnormal findings: Secondary | ICD-10-CM

## 2021-07-08 DIAGNOSIS — E782 Mixed hyperlipidemia: Secondary | ICD-10-CM

## 2021-07-08 DIAGNOSIS — Z1231 Encounter for screening mammogram for malignant neoplasm of breast: Secondary | ICD-10-CM

## 2021-07-08 MED ORDER — NA SULFATE-K SULFATE-MG SULF 17.5-3.13-1.6 GM/177ML PO SOLN: 1.0000 | Freq: Once | ORAL | 0 refills | Status: AC

## 2021-07-08 NOTE — Progress Notes (Signed)
I,Sha'taria Tyson,acting as a Education administrator for [employer], PA-C.,have documented all relevant documentation on the behalf of [name], PA-C,as directed by  [name], PA-C while in the presence of [name], PA-C.   Complete physical exam   [name]   DOB: 02-18-1955   66 y.o. Female  MRN: [id number] Visit Date: 07/08/2021  Today's healthcare provider: [name], PA-C   Cc. CPE  Subjective    Karen Boyd is a 66 y.o. female who presents today for a complete physical exam.  She reports consuming a general diet. The patient has a physically strenuous job, but has no regular exercise apart from work.  She generally feels well. She reports sleeping fairly well. She does not have additional problems to discuss today.   Past Medical History:  Diagnosis Date   GERD (gastroesophageal reflux disease)    Hypertension    Past Surgical History:  Procedure Laterality Date   ANKLE SURGERY     APPENDECTOMY     cataract surgery x3  2020-2021   2 in 2020 and 1 in 2021   ELBOW SURGERY Left    TOTAL ABDOMINAL HYSTERECTOMY W/ BILATERAL SALPINGOOPHORECTOMY  08/16/2001   large 18 cm leiomyoma   Social History   Socioeconomic History   Marital status: Single    Spouse name: Not on file   Number of children: Not on file   Years of education: Not on file   Highest education level: Not on file  Occupational History   Not on file  Tobacco Use   Smoking status: Never   Smokeless tobacco: Never  Vaping Use   Vaping Use: Never used  Substance and Sexual Activity   Alcohol use: No    Alcohol/week: 0.0 standard drinks   Drug use: No   Sexual activity: Not on file  Other Topics Concern   Not on file  Social History Narrative   Not on file   Social Determinants of Health   Financial Resource Strain: Not on file  Food Insecurity: Not on file  Transportation Needs: Not on file  Physical Activity: Not on file  Stress: Not on file  Social  Connections: Not on file  Intimate Partner Violence: Not on file   Family Status  Relation Name Status   Father  Deceased at age 71   MGM  Deceased   PGF  Deceased at age 1   MGF  Deceased   PGM  Deceased   Brother  Insurance risk surveyor   Mother  (Not Specified)   Family History  Problem Relation Age of Onset   Heart failure Father    Ovarian cancer Maternal Grandmother    Cirrhosis Paternal Grandfather    Hypertension Mother    Diabetes Mother    Gout Mother    Allergies  Allergen Reactions   Shellfish Allergy Anaphylaxis    Patient Care Team: [name], PA-C as PCP - General (Physician Assistant)   Medications: Outpatient Medications Prior to Visit  Medication Sig   acetaminophen (TYLENOL) 500 MG tablet Take 500-1,000 mg by mouth daily as needed for moderate pain or headache.   lisinopril (ZESTRIL) 20 MG tablet TAKE 1 TABLET BY MOUTH EVERY DAY   meloxicam (MOBIC) 15 MG tablet meloxicam 15 mg tablet  TAKE 1 TABLET BY MOUTH EVERY DAY WITH A MEAL   [DISCONTINUED] Olopatadine HCl (PATADAY OP) Place 1 drop into both eyes daily.   No facility-administered medications prior to visit.  Review of Systems  Constitutional:  Negative for fatigue and fever.  Respiratory:  Negative for cough and shortness of breath.   Cardiovascular:  Negative for chest pain and leg swelling.  Gastrointestinal:  Negative for abdominal pain.  Neurological:  Negative for dizziness and headaches.     Objective     Blood pressure 134/82, pulse 77, height '5\' 5"'$  (1.651 m), weight 171 lb 8 oz (77.8 kg), SpO2 98 %.    Physical Exam Constitutional:      General: She is awake.     Appearance: She is well-developed. She is not ill-appearing.  HENT:     Head: Normocephalic.     Right Ear: Tympanic membrane normal.     Left Ear: Tympanic membrane normal.     Nose: Nose normal. No congestion or rhinorrhea.     Mouth/Throat:     Pharynx: No oropharyngeal exudate or posterior  oropharyngeal erythema.  Eyes:     Conjunctiva/sclera: Conjunctivae normal.     Pupils: Pupils are equal, round, and reactive to light.  Neck:     Thyroid: No thyroid mass or thyromegaly.  Cardiovascular:     Rate and Rhythm: Normal rate and regular rhythm.     Heart sounds: Normal heart sounds.  Pulmonary:     Effort: Pulmonary effort is normal.     Breath sounds: Normal breath sounds.  Abdominal:     Palpations: Abdomen is soft.     Tenderness: There is no abdominal tenderness.  Musculoskeletal:     Right lower leg: No swelling. No edema.     Left lower leg: No swelling. No edema.  Lymphadenopathy:     Cervical: No cervical adenopathy.  Skin:    General: Skin is warm.  Neurological:     Mental Status: She is alert and oriented to person, place, and time.  Psychiatric:        Attention and Perception: Attention normal.        Mood and Affect: Mood normal.        Speech: Speech normal.        Behavior: Behavior normal. Behavior is cooperative.    Last depression screening scores    07/08/2021    9:07 AM 11/30/2019   10:04 AM 10/04/2017    4:29 PM  PHQ 2/9 Scores  PHQ - 2 Score 0 0 0  PHQ- 9 Score 0  6   Last fall risk screening    07/08/2021    9:07 AM  [place] in the past year? 0  Number falls in past yr: 0  Injury with Fall? 0  Risk for fall due to : No Fall Risks   Last Audit-C alcohol use screening    07/08/2021    9:07 AM  Alcohol Use Disorder Test (AUDIT)  1. How often do you have a drink containing alcohol? 0  2. How many drinks containing alcohol do you have on a typical day when you are drinking? 0  3. How often do you have six or more drinks on one occasion? 0  AUDIT-C Score 0   A score of 3 or more in women, and 4 or more in men indicates increased risk for alcohol abuse, EXCEPT if all of the points are from question 1   No results found for any visits on 07/08/21.  Assessment & Plan    Routine Health Maintenance and Physical  Exam  Exercise Activities and Dietary recommendations --balanced diet high in fiber and protein,  low in sugars, carbs, fats. --physical activity/exercise 30 minutes 3-5 times a week    Immunization History  Administered Date(s) Administered   Td 07/08/2021    Health Maintenance  Topic Date Due   Hepatitis C Screening  Never done   PAP SMEAR-Modifier  07/08/2021 (Originally 12/21/1978)   Zoster Vaccines- Shingrix (1 of 2) 10/08/2021 (Originally 12/21/2007)   MAMMOGRAM  07/09/2022 (Originally 12/21/2007)   COLONOSCOPY (Pts 45-72yr Insurance coverage will need to be confirmed)  07/09/2022 (Originally 10/24/2011)   HIV Screening  07/09/2022 (Originally 12/20/1972)   INFLUENZA VACCINE  09/01/2021   TETANUS/TDAP  07/09/2031   HPV VACCINES  Aged Out    Discussed health benefits of physical activity, and encouraged her to engage in regular exercise appropriate for her age and condition.  Problem List Items Addressed This Visit       Cardiovascular and Mediastinum   Essential (primary) hypertension    Elevated in office, but pt reports WNR at home. Advised if 130s/90s at home to call office as we will have to increase medication at that point Will check cmp , continue current medications       Relevant Orders   Comprehensive metabolic panel     Other   Encounter for annual physical exam - Primary    Declines mammogram. Pt states she does self checks. Declines shingles vaccine.  H/o total hysterectomy d/t benign tumor, does not need pap       Relevant Orders   Comprehensive metabolic panel   TSH + free T4   Lipid panel   HgB A1c   CBC w/Diff/Platelet   Moderate mixed hyperlipidemia not requiring statin therapy    Historically, will check fasting lipids today.       Relevant Orders   Comprehensive metabolic panel   Lipid panel   Other Visit Diagnoses     Need for hepatitis C screening test       Relevant Orders   Hepatitis C Antibody   Need for tetanus booster        Relevant Orders   Td : Tetanus/diphtheria >7yo Preservative  free (Completed)   Encounter for screening colonoscopy       Relevant Orders   Ambulatory referral to Gastroenterology        Return in about 6 months (around 01/07/2022) for hypertension.     I, LMikey Kirschner PA-C have reviewed all documentation for this visit. The documentation on  07/08/2021 for the exam, diagnosis, procedures, and orders are all accurate and complete.  LMikey Kirschner PA-C [facility] [place] [id number]: [phone]: 3[place]

## 2021-07-08 NOTE — Assessment & Plan Note (Signed)
Historically, will check fasting lipids today.

## 2021-07-08 NOTE — Assessment & Plan Note (Signed)
Elevated in office, but pt reports WNR at home. Advised if 130s/90s at home to call office as we will have to increase medication at that point Will check cmp , continue current medications

## 2021-07-08 NOTE — Assessment & Plan Note (Addendum)
Declines mammogram. Pt states she does self checks. Declines shingles vaccine.  H/o total hysterectomy d/t benign tumor, does not need pap

## 2021-07-08 NOTE — Telephone Encounter (Signed)
Gastroenterology Pre-Procedure Review  Request Date: 09/10/21 Requesting Physician: Dr. Vicente Males  PATIENT REVIEW QUESTIONS: The patient responded to the following health history questions as indicated:    1. Are you having any GI issues? no 2. Do you have a personal history of Polyps? no 3. Do you have a family history of Colon Cancer or Polyps? no 4. Diabetes Mellitus? no 5. Joint replacements in the past 12 months?no 6. Major health problems in the past 3 months?no 7. Any artificial heart valves, MVP, or defibrillator?no    MEDICATIONS & ALLERGIES:    Patient reports the following regarding taking any anticoagulation/antiplatelet therapy:   Plavix, Coumadin, Eliquis, Xarelto, Lovenox, Pradaxa, Brilinta, or Effient? no Aspirin? no  Patient confirms/reports the following medications:  Current Outpatient Medications  Medication Sig Dispense Refill   acetaminophen (TYLENOL) 500 MG tablet Take 500-1,000 mg by mouth daily as needed for moderate pain or headache.     lisinopril (ZESTRIL) 20 MG tablet TAKE 1 TABLET BY MOUTH EVERY DAY 90 tablet 1   meloxicam (MOBIC) 15 MG tablet meloxicam 15 mg tablet  TAKE 1 TABLET BY MOUTH EVERY DAY WITH A MEAL     No current facility-administered medications for this visit.    Patient confirms/reports the following allergies:  Allergies  Allergen Reactions   Shellfish Allergy Anaphylaxis    No orders of the defined types were placed in this encounter.   AUTHORIZATION INFORMATION Primary Insurance: 1D#: Group #:  Secondary Insurance: 1D#: Group #:  SCHEDULE INFORMATION: Date: 09/10/21 Time: Location: [facility]

## 2021-07-09 LAB — COMPREHENSIVE METABOLIC PANEL
ALT: 35 IU/L — ABNORMAL HIGH (ref 0–32)
AST: 35 IU/L (ref 0–40)
Albumin/Globulin Ratio: 1.7 (ref 1.2–2.2)
Albumin: 4.3 g/dL (ref 3.8–4.8)
Alkaline Phosphatase: 109 IU/L (ref 44–121)
BUN/Creatinine Ratio: 24 (ref 12–28)
BUN: 24 mg/dL (ref 8–27)
Bilirubin Total: 0.3 mg/dL (ref 0.0–1.2)
CO2: 19 mmol/L — ABNORMAL LOW (ref 20–29)
Calcium: 9.2 mg/dL (ref 8.7–10.3)
Chloride: 105 mmol/L (ref 96–106)
Creatinine, Ser: 1.01 mg/dL — ABNORMAL HIGH (ref 0.57–1.00)
Globulin, Total: 2.6 g/dL (ref 1.5–4.5)
Glucose: 101 mg/dL — ABNORMAL HIGH (ref 70–99)
Potassium: 5.2 mmol/L (ref 3.5–5.2)
Sodium: 137 mmol/L (ref 134–144)
Total Protein: 6.9 g/dL (ref 6.0–8.5)
eGFR: 63 mL/min/{1.73_m2} (ref >59)

## 2021-07-09 LAB — LIPID PANEL
Chol/HDL Ratio: 3.1 ratio (ref 0.0–4.4)
Cholesterol, Total: 184 mg/dL (ref 100–199)
HDL: 60 mg/dL (ref >39)
LDL Chol Calc (NIH): 113 mg/dL — ABNORMAL HIGH (ref 0–99)
Triglycerides: 58 mg/dL (ref 0–149)
VLDL Cholesterol Cal: 11 mg/dL (ref 5–40)

## 2021-07-09 LAB — CBC WITH DIFFERENTIAL/PLATELET
Basophils Absolute: 0 10*3/uL (ref 0.0–0.2)
Basos: 1 %
EOS (ABSOLUTE): 0.2 10*3/uL (ref 0.0–0.4)
Eos: 5 %
Hematocrit: 34.6 % (ref 34.0–46.6)
Hemoglobin: 11.9 g/dL (ref 11.1–15.9)
Immature Grans (Abs): 0 10*3/uL (ref 0.0–0.1)
Immature Granulocytes: 0 %
Lymphocytes Absolute: 1.7 10*3/uL (ref 0.7–3.1)
Lymphs: 33 %
MCH: 31.3 pg (ref 26.6–33.0)
MCHC: 34.4 g/dL (ref 31.5–35.7)
MCV: 91 fL (ref 79–97)
Monocytes Absolute: 0.5 10*3/uL (ref 0.1–0.9)
Monocytes: 9 %
Neutrophils Absolute: 2.7 10*3/uL (ref 1.4–7.0)
Neutrophils: 52 %
Platelets: 322 10*3/uL (ref 150–450)
RBC: 3.8 x10E6/uL (ref 3.77–5.28)
RDW: 11.7 % (ref 11.7–15.4)
WBC: 5.1 10*3/uL (ref 3.4–10.8)

## 2021-07-09 LAB — HEMOGLOBIN A1C
Est. average glucose Bld gHb Est-mCnc: 111 mg/dL
Hgb A1c MFr Bld: 5.5 % (ref 4.8–5.6)

## 2021-07-09 LAB — HEPATITIS C ANTIBODY: Hep C Virus Ab: NONREACTIVE

## 2021-07-09 LAB — TSH+FREE T4
Free T4: 0.79 ng/dL — ABNORMAL LOW (ref 0.82–1.77)
TSH: 1.26 u[IU]/mL (ref 0.450–4.500)

## 2021-09-02 ENCOUNTER — Telehealth: Payer: Self-pay

## 2021-09-02 NOTE — Telephone Encounter (Signed)
Returned patients call to schedule her canceled colonoscopy from 09/10/21.  LVM for pt to return my call to get procedure rescheduled.  Thanks, [place], [place]

## 2021-09-04 ENCOUNTER — Other Ambulatory Visit: Payer: Self-pay

## 2021-09-04 DIAGNOSIS — Z1211 Encounter for screening for malignant neoplasm of colon: Secondary | ICD-10-CM

## 2021-09-10 ENCOUNTER — Ambulatory Visit: Admit: 2021-09-10 | Payer: Self-pay | Admitting: Gastroenterology

## 2021-09-10 SURGERY — COLONOSCOPY WITH PROPOFOL
Anesthesia: General

## 2021-09-23 ENCOUNTER — Other Ambulatory Visit: Payer: Self-pay | Admitting: Family Medicine

## 2021-09-23 DIAGNOSIS — I10 Essential (primary) hypertension: Secondary | ICD-10-CM

## 2021-10-08 ENCOUNTER — Ambulatory Visit
Admission: RE | Admit: 2021-10-08 | Discharge: 2021-10-08 | Disposition: A | Discharge status: Home / Self Care | Payer: No Typology Code available for payment source | Source: Ambulatory Visit | Attending: Nurse Practitioner | Admitting: Nurse Practitioner

## 2021-10-08 ENCOUNTER — Other Ambulatory Visit: Payer: Self-pay | Admitting: Nurse Practitioner

## 2021-10-08 DIAGNOSIS — R609 Edema, unspecified: Secondary | ICD-10-CM

## 2021-10-08 DIAGNOSIS — S6991XA Unspecified injury of right wrist, hand and finger(s), initial encounter: Secondary | ICD-10-CM

## 2021-10-21 ENCOUNTER — Telehealth: Payer: Self-pay | Admitting: Gastroenterology

## 2021-10-21 DIAGNOSIS — Z1211 Encounter for screening for malignant neoplasm of colon: Secondary | ICD-10-CM

## 2021-10-21 NOTE — Telephone Encounter (Signed)
Patient called to reschedule colonoscopy that is scheduled for 10/26/2021. Patient states she does not have a ride to her appointment. Requesting call back.

## 2021-10-22 ENCOUNTER — Other Ambulatory Visit: Payer: Self-pay

## 2021-10-22 DIAGNOSIS — Z1211 Encounter for screening for malignant neoplasm of colon: Secondary | ICD-10-CM

## 2021-10-22 MED ORDER — NA SULFATE-K SULFATE-MG SULF 17.5-3.13-1.6 GM/177ML PO SOLN: 1.0000 | Freq: Once | ORAL | 0 refills | Status: AC

## 2021-10-22 NOTE — Telephone Encounter (Signed)
Patients call has been returned.  Her colonoscopy has been rescheduled with Dr. Vicente Males to 11/12/21.  Rx resent to [facility] on [facility].  Thanks,  [place], [place]

## 2021-10-22 NOTE — Addendum Note (Signed)
Addended by: Vanetta Mulders on: 10/22/2021 02:14 PM   Modules accepted: Orders

## 2021-11-12 ENCOUNTER — Encounter: Admission: RE | Payer: Self-pay | Source: Home / Self Care

## 2021-11-12 ENCOUNTER — Ambulatory Visit: Admission: RE | Admit: 2021-11-12 | Payer: 59 | Source: Home / Self Care | Admitting: Gastroenterology

## 2021-11-12 SURGERY — COLONOSCOPY WITH PROPOFOL
Anesthesia: General

## 2022-01-13 ENCOUNTER — Ambulatory Visit: Payer: 59 | Admitting: Physician Assistant

## 2022-02-26 ENCOUNTER — Ambulatory Visit: Payer: 59 | Admitting: Physician Assistant

## 2022-02-26 ENCOUNTER — Encounter: Payer: Self-pay | Admitting: Physician Assistant

## 2022-02-26 VITALS — BP 145/88 | HR 82 | Ht 65.0 in | Wt 175.1 lb

## 2022-02-26 DIAGNOSIS — Z1231 Encounter for screening mammogram for malignant neoplasm of breast: Secondary | ICD-10-CM | POA: Diagnosis not present

## 2022-02-26 DIAGNOSIS — I10 Essential (primary) hypertension: Secondary | ICD-10-CM | POA: Diagnosis not present

## 2022-02-26 MED ORDER — LISINOPRIL 40 MG PO TABS: 40.0000 mg | ORAL_TABLET | Freq: Every day | ORAL | 5 refills | Status: DC

## 2022-02-26 NOTE — Progress Notes (Signed)
I,Sha'taria Tyson,acting as a Education administrator for [employer], PA-C.,have documented all relevant documentation on the behalf of Karen Kirschner, PA-C,as directed by  Karen Kirschner, PA-C while in the presence of Karen Kirschner, PA-C.   Established patient visit   [name]   DOB: 1955/10/12   67 y.o. Female  MRN: [id number] Visit Date: 02/26/2022  Today's healthcare provider: [name], PA-C   Cc. HTN f/u  Subjective    HPI   Hypertension, follow-up  BP Readings from Last 3 Encounters:  02/26/22 (!) 145/88  07/08/21 134/82  10/16/20 (!) 165/91   Wt Readings from Last 3 Encounters:  02/26/22 175 lb 1.6 oz (79.4 kg)  07/08/21 171 lb 8 oz (77.8 kg)  10/16/20 180 lb (81.6 kg)     She was last seen for hypertension 7 months ago.  BP at that visit was 134/82. Management since that visit includes continue current medications and monitor at home.  Pt has a left labral tear which is giving her significant pain, plans to have a hip replacement in the near future.  Outside blood pressures are 141-145/88 at work. Sometimes higher. Symptoms: No chest pain No chest pressure  No palpitations No syncope  No dyspnea No orthopnea  No paroxysmal nocturnal dyspnea No lower extremity edema   Pertinent labs Lab Results  Component Value Date   CHOL 184 07/08/2021   HDL 60 07/08/2021   LDLCALC 113 (H) 07/08/2021   TRIG 58 07/08/2021   CHOLHDL 3.1 07/08/2021   Lab Results  Component Value Date   NA 137 07/08/2021   K 5.2 07/08/2021   CREATININE 1.01 (H) 07/08/2021   EGFR 63 07/08/2021   GLUCOSE 101 (H) 07/08/2021   TSH 1.260 07/08/2021     The 10-year ASCVD risk score (Arnett DK, et al., 2019) is: 7.8%  ---------------------------------------------------------------------------------------------------    Medications: Outpatient Medications Prior to Visit  Medication Sig   acetaminophen (TYLENOL) 500 MG tablet Take 500-1,000 mg by mouth daily as needed  for moderate pain or headache.   meloxicam (MOBIC) 15 MG tablet meloxicam 15 mg tablet  TAKE 1 TABLET BY MOUTH EVERY DAY WITH A MEAL   [DISCONTINUED] lisinopril (ZESTRIL) 20 MG tablet TAKE 1 TABLET BY MOUTH EVERY DAY   No facility-administered medications prior to visit.      Objective    Blood pressure (!) 145/88, pulse 82, height '5\' 5"'$  (1.651 m), weight 175 lb 1.6 oz (79.4 kg), SpO2 100 %.   Physical Exam Constitutional:      General: She is awake.     Appearance: She is well-developed.  HENT:     Head: Normocephalic.  Eyes:     Conjunctiva/sclera: Conjunctivae normal.  Cardiovascular:     Rate and Rhythm: Normal rate and regular rhythm.     Heart sounds: Normal heart sounds.  Pulmonary:     Effort: Pulmonary effort is normal.     Breath sounds: Normal breath sounds.  Skin:    General: Skin is warm.  Neurological:     Mental Status: She is alert and oriented to person, place, and time.  Psychiatric:        Attention and Perception: Attention normal.        Mood and Affect: Mood normal.        Speech: Speech normal.        Behavior: Behavior is cooperative.      No results found for any visits on 02/26/22.  Assessment & Plan  Problem List Items Addressed This Visit       Cardiovascular and Mediastinum   Essential (primary) hypertension - Primary    Elevated in office and at home Recommending increasing lisinopril to 40 mg daily Continue to monitor F/u in 4 weeks      Relevant Medications   lisinopril (ZESTRIL) 40 MG tablet   Other Visit Diagnoses     Breast cancer screening by mammogram       Relevant Orders   MM 3D SCREEN BREAST BILATERAL       Pt has deferred colonoscopy until after her hip surgery. Pt declines vaccines today.  Return in about 4 weeks (around 03/26/2022) for hypertension.      I, Karen Kirschner, PA-C have reviewed all documentation for this visit. The documentation on  02/26/22  for the exam, diagnosis, procedures, and orders  are all accurate and complete.  Karen Kirschner, PA-C [facility] [address] [place], [place], [address] Office: [phone] Fax: [place]

## 2022-02-26 NOTE — Assessment & Plan Note (Signed)
Elevated in office and at home Recommending increasing lisinopril to 40 mg daily Continue to monitor F/u in 4 weeks

## 2022-03-15 ENCOUNTER — Encounter: Payer: Self-pay | Admitting: Physician Assistant

## 2022-03-16 NOTE — Progress Notes (Unsigned)
  I,Sulibeya S Dimas,acting as a Education administrator for [employer], PA-C.,have documented all relevant documentation on the behalf of [name], PA-C,as directed by  [name], PA-C while in the presence of [name], PA-C.     Established patient visit   [name]   DOB: 06-11-1955   67 y.o. Female  MRN: [id number] Visit Date: 03/17/2022  Today's healthcare provider: [name], PA-C   No chief complaint on file.  Subjective    Rash This is a new problem. The problem is unchanged. The affected locations include the abdomen, back, left arm, right arm, left upper leg, left lower leg, right upper leg and right lower leg.  Lisinopril was increase to 40 mg daily on 02/26/22 due to elevated BP reading at home and office. I researched the side effects of Lisinopril, and it's stated to seek medical advice if this occurs.  Medications: Outpatient Medications Prior to Visit  Medication Sig   acetaminophen (TYLENOL) 500 MG tablet Take 500-1,000 mg by mouth daily as needed for moderate pain or headache.   lisinopril (ZESTRIL) 40 MG tablet Take 1 tablet (40 mg total) by mouth daily.   meloxicam (MOBIC) 15 MG tablet meloxicam 15 mg tablet  TAKE 1 TABLET BY MOUTH EVERY DAY WITH A MEAL   No facility-administered medications prior to visit.    Review of Systems  Skin:  Positive for rash.    {Labs  Heme  Chem  Endocrine  Serology  Results Review (optional):23779}   Objective    There were no vitals taken for this visit. {Show previous vital signs (optional):23777}  Physical Exam  ***  No results found for any visits on 03/17/22.  Assessment & Plan     ***  No follow-ups on file.      {provider attestation***:1}   [name], PA-C  [place] [phone] (phone) [phone] (fax)  [place]

## 2022-03-17 ENCOUNTER — Encounter: Payer: Self-pay | Admitting: Physician Assistant

## 2022-03-17 ENCOUNTER — Ambulatory Visit: Payer: 59 | Admitting: Physician Assistant

## 2022-03-17 VITALS — BP 120/69 | HR 80 | Temp 98.6°F | Resp 16 | Wt 171.6 lb

## 2022-03-17 DIAGNOSIS — I10 Essential (primary) hypertension: Secondary | ICD-10-CM | POA: Diagnosis not present

## 2022-03-17 DIAGNOSIS — R21 Rash and other nonspecific skin eruption: Secondary | ICD-10-CM | POA: Diagnosis not present

## 2022-03-17 NOTE — Assessment & Plan Note (Signed)
Well controlled now on lisinopril 40 mg Will continue to monitor rash but unlikely a reaction to dose change Will check cmp today  F/u 6 mo

## 2022-03-18 LAB — CBC WITH DIFFERENTIAL/PLATELET
Basophils Absolute: 0.1 10*3/uL (ref 0.0–0.2)
Basos: 1 %
EOS (ABSOLUTE): 0.2 10*3/uL (ref 0.0–0.4)
Eos: 4 %
Hematocrit: 35.1 % (ref 34.0–46.6)
Hemoglobin: 12 g/dL (ref 11.1–15.9)
Immature Grans (Abs): 0 10*3/uL (ref 0.0–0.1)
Immature Granulocytes: 0 %
Lymphocytes Absolute: 1.8 10*3/uL (ref 0.7–3.1)
Lymphs: 36 %
MCH: 30.5 pg (ref 26.6–33.0)
MCHC: 34.2 g/dL (ref 31.5–35.7)
MCV: 89 fL (ref 79–97)
Monocytes Absolute: 0.5 10*3/uL (ref 0.1–0.9)
Monocytes: 9 %
Neutrophils Absolute: 2.5 10*3/uL (ref 1.4–7.0)
Neutrophils: 50 %
Platelets: 334 10*3/uL (ref 150–450)
RBC: 3.94 x10E6/uL (ref 3.77–5.28)
RDW: 11.6 % — ABNORMAL LOW (ref 11.7–15.4)
WBC: 5 10*3/uL (ref 3.4–10.8)

## 2022-03-18 LAB — COMPREHENSIVE METABOLIC PANEL
ALT: 20 IU/L (ref 0–32)
AST: 18 IU/L (ref 0–40)
Albumin/Globulin Ratio: 1.9 (ref 1.2–2.2)
Albumin: 4.6 g/dL (ref 3.9–4.9)
Alkaline Phosphatase: 102 IU/L (ref 44–121)
BUN/Creatinine Ratio: 24 (ref 12–28)
BUN: 23 mg/dL (ref 8–27)
Bilirubin Total: 0.4 mg/dL (ref 0.0–1.2)
CO2: 19 mmol/L — ABNORMAL LOW (ref 20–29)
Calcium: 9.4 mg/dL (ref 8.7–10.3)
Chloride: 103 mmol/L (ref 96–106)
Creatinine, Ser: 0.95 mg/dL (ref 0.57–1.00)
Globulin, Total: 2.4 g/dL (ref 1.5–4.5)
Glucose: 92 mg/dL (ref 70–99)
Potassium: 5.3 mmol/L — ABNORMAL HIGH (ref 3.5–5.2)
Sodium: 138 mmol/L (ref 134–144)
Total Protein: 7 g/dL (ref 6.0–8.5)
eGFR: 67 mL/min/{1.73_m2} (ref >59)

## 2022-03-31 ENCOUNTER — Ambulatory Visit: Payer: 59 | Admitting: Physician Assistant

## 2022-07-21 ENCOUNTER — Ambulatory Visit
Admission: RE | Admit: 2022-07-21 | Discharge: 2022-07-21 | Disposition: A | Discharge status: Home / Self Care | Payer: 59 | Source: Ambulatory Visit | Attending: Physician Assistant | Admitting: Physician Assistant

## 2022-07-21 DIAGNOSIS — Z1231 Encounter for screening mammogram for malignant neoplasm of breast: Secondary | ICD-10-CM | POA: Insufficient documentation

## 2022-07-22 ENCOUNTER — Inpatient Hospital Stay
Admission: RE | Admit: 2022-07-22 | Discharge: 2022-07-22 | Disposition: A | Discharge status: Home / Self Care | Payer: Self-pay | Source: Ambulatory Visit | Attending: Physician Assistant | Admitting: Physician Assistant

## 2022-07-22 ENCOUNTER — Other Ambulatory Visit: Payer: Self-pay | Admitting: *Deleted

## 2022-07-22 DIAGNOSIS — Z1231 Encounter for screening mammogram for malignant neoplasm of breast: Secondary | ICD-10-CM

## 2022-08-23 ENCOUNTER — Other Ambulatory Visit: Payer: Self-pay | Admitting: Physician Assistant

## 2022-08-23 DIAGNOSIS — I10 Essential (primary) hypertension: Secondary | ICD-10-CM

## 2022-08-24 NOTE — Telephone Encounter (Signed)
Requested Prescriptions  Pending Prescriptions Disp Refills   lisinopril (ZESTRIL) 40 MG tablet [Pharmacy Med Name: LISINOPRIL 40 MG TABLET] 90 tablet 0    Sig: TAKE 1 TABLET BY MOUTH EVERY DAY     Cardiovascular:  ACE Inhibitors Failed - 08/23/2022  2:29 AM      Failed - K in normal range and within 180 days    Potassium  Date Value Ref Range Status  03/17/2022 5.3 (H) 3.5 - 5.2 mmol/L Final         Passed - Cr in normal range and within 180 days    Creatinine, Ser  Date Value Ref Range Status  03/17/2022 0.95 0.57 - 1.00 mg/dL Final         Passed - Patient is not pregnant      Passed - Last BP in normal range    BP Readings from Last 1 Encounters:  03/17/22 120/69         Passed - Valid encounter within last 6 months    Recent Outpatient Visits           5 months ago Rash   [facility] Alfredia Ferguson, PA-C   5 months ago Essential (primary) hypertension   [facility] Alfredia Ferguson, PA-C   1 year ago Encounter for annual physical exam   [facility] Alfredia Ferguson, PA-C   1 year ago Essential (primary) hypertension   [facility] Bosie Clos, MD   2 years ago Right sided abdominal pain   [place] [facility] Chrismon, Jodell Cipro, PA-C       Future Appointments             In 3 weeks Pardue, Monico Blitz, DO  [facility]

## 2022-09-15 ENCOUNTER — Encounter: Payer: Self-pay | Admitting: Family Medicine

## 2022-09-15 ENCOUNTER — Ambulatory Visit: Payer: 59 | Admitting: Family Medicine

## 2022-09-15 ENCOUNTER — Ambulatory Visit: Payer: 59 | Admitting: Physician Assistant

## 2022-09-15 VITALS — BP 158/80 | HR 80 | Temp 98.2°F | Ht 65.0 in | Wt 169.0 lb

## 2022-09-15 DIAGNOSIS — N182 Chronic kidney disease, stage 2 (mild): Secondary | ICD-10-CM

## 2022-09-15 DIAGNOSIS — I10 Essential (primary) hypertension: Secondary | ICD-10-CM

## 2022-09-15 DIAGNOSIS — Z0000 Encounter for general adult medical examination without abnormal findings: Secondary | ICD-10-CM

## 2022-09-15 DIAGNOSIS — Z5320 Procedure and treatment not carried out because of patient's decision for unspecified reasons: Secondary | ICD-10-CM

## 2022-09-15 DIAGNOSIS — H3530 Unspecified macular degeneration: Secondary | ICD-10-CM | POA: Diagnosis not present

## 2022-09-15 NOTE — Progress Notes (Signed)
Established patient visit   [name]   DOB: 1955/11/30   67 y.o. Female  MRN: [id number] Visit Date: 09/15/2022  Today's healthcare provider: Sherlyn Hay, DO   Chief Complaint  Patient presents with   Hypertension    Patient was seen for this in January when at that time her Lisinopril was increased to 40 mg.  She was asked to return in 4 weeks.  When she returned in February she complained of a rash.  It was not thought to be a result of the Lisinopril but she was asked to monitor the rash.  Her blood pressure at that time was better controlled and she reports it continues to be controlled with readings 120-130's over 80's.  Her rash cleared and she has not had any further issue with it.    Subjective    Hypertension Pertinent negatives include no chest pain, palpitations or shortness of breath.   HPI     Hypertension    Additional comments: Patient was seen for this in January when at that time her Lisinopril was increased to 40 mg.  She was asked to return in 4 weeks.  When she returned in February she complained of a rash.  It was not thought to be a result of the Lisinopril but she was asked to monitor the rash.  Her blood pressure at that time was better controlled and she reports it continues to be controlled with readings 120-130's over 80's.  Her rash cleared and she has not had any further issue with it.       Last edited by Adline Peals, CMA on 09/15/2022  8:28 AM.      Hip replacement done in May; already walking 1.5 miles Drinks over a gallon of water a day Drinks one 12 oz ginger ale per day Not adding salt to her food anymore Overall she feels that she is doing pretty well.  She does spend a large amount of her time in the sun and works outdoors.  She states that she prefers to be outdoors to not become overly accustomed to and dependent on air conditioning.  Declined flu and other vaccines at this time. Colonoscopy: Was scheduled  last year; had to reschedule twice and cancelled due to being too dehydrated.     Medications: Outpatient Medications Prior to Visit  Medication Sig   acetaminophen (TYLENOL) 500 MG tablet Take 500-1,000 mg by mouth daily as needed for moderate pain or headache.   lisinopril (ZESTRIL) 40 MG tablet TAKE 1 TABLET BY MOUTH EVERY DAY   methocarbamol (ROBAXIN) 500 MG tablet Take 500 mg by mouth 3 (three) times daily.   [DISCONTINUED] meloxicam (MOBIC) 15 MG tablet meloxicam 15 mg tablet  TAKE 1 TABLET BY MOUTH EVERY DAY WITH A MEAL   No facility-administered medications prior to visit.    Review of Systems  Constitutional:  Negative for appetite change, chills, fatigue and fever.  Respiratory:  Negative for chest tightness and shortness of breath.   Cardiovascular:  Negative for chest pain and palpitations.  Gastrointestinal:  Negative for abdominal pain, nausea and vomiting.  Neurological:  Negative for dizziness and weakness.         Objective    BP (!) 158/80 (BP Location: Left Arm, Patient Position: Sitting, Cuff Size: Normal)   Pulse 80   Temp 98.2 F (36.8 C) (Oral)   Ht 5\' 5"  (1.651 m)   Wt 169 lb (76.7 kg)  SpO2 99%   BMI 28.12 kg/m   Vitals:   09/15/22 0834 09/15/22 0835  BP: (!) 144/82 (!) 158/80  Pulse: 80   Temp: 98.2 F (36.8 C)   TempSrc: Oral   SpO2: 99%   Weight: 169 lb (76.7 kg)   Height: 5\' 5"  (1.651 m)      Physical Exam Vitals reviewed.  Constitutional:      General: She is not in acute distress.    Appearance: Normal appearance. She is well-developed. She is not diaphoretic.  HENT:     Head: Normocephalic and atraumatic.     Right Ear: Tympanic membrane, ear canal and external ear normal.     Left Ear: Tympanic membrane, ear canal and external ear normal.     Nose: Nose normal.     Mouth/Throat:     Mouth: Mucous membranes are moist.     Pharynx: Oropharynx is clear. No oropharyngeal exudate.  Eyes:     General: No scleral icterus.     Extraocular Movements: Extraocular movements intact.     Conjunctiva/sclera: Conjunctivae normal.     Pupils: Pupils are equal, round, and reactive to light.  Neck:     Thyroid: No thyromegaly.  Cardiovascular:     Rate and Rhythm: Normal rate and regular rhythm.     Pulses: Normal pulses.     Heart sounds: Normal heart sounds. No murmur heard. Pulmonary:     Effort: Pulmonary effort is normal. No respiratory distress.     Breath sounds: Normal breath sounds. No wheezing or rales.  Abdominal:     General: Bowel sounds are normal. There is no distension.     Palpations: Abdomen is soft. There is no mass.     Tenderness: There is abdominal tenderness (Baseline per patient) in the left upper quadrant. There is no guarding.  Musculoskeletal:        General: No deformity.     Cervical back: Neck supple.     Right lower leg: No edema.     Left lower leg: No edema.  Lymphadenopathy:     Cervical: No cervical adenopathy.  Skin:    General: Skin is warm and dry.     Findings: No rash.          Comments: Scar as noted in pink. Skin to forearms of bilateral upper extremities seems to be somewhat thickened and demonstrates multiple pink lesions suggestive of scarring from previous injuries that are densely dispersed across her forearms.  She has similar lesions to the lower halves of her lower extremities bilaterally.  Neurological:     Mental Status: She is alert and oriented to person, place, and time. Mental status is at baseline.     Gait: Gait normal.  Psychiatric:        Mood and Affect: Mood normal.        Behavior: Behavior normal.        Thought Content: Thought content normal.      No results found for any visits on 09/15/22.  Assessment & Plan    Essential (primary) hypertension Assessment & Plan: Patient does endorse consistent blood pressures with systolics in the 120s to 130s over 80s at home, as well as when she checks using the sphygmomanometer at work.  Given this  consistency while at home and her endorsement of anxiety when her blood pressures checked in the clinic, I will not adjust her blood pressure medications today.  I did advise her to bring her blood pressure  cuff in to her next visit so we can compare to be completely sure her home blood pressures are in fact accurate.  Patient expressed understanding and stated she would bring it with her.  Orders: -     Microalbumin / creatinine urine ratio -     Comprehensive metabolic panel -     Lipid panel -     Vitamin B12  Annual physical exam Assessment & Plan: Overall benign physical exam.  Will order routine lab work as noted.  Orders: -     TSH Rfx on Abnormal to Free T4  Age-related macular degeneration Assessment & Plan: Noted on her recent eye exam to be stable.   Chronic kidney disease, stage 2, mildly decreased GFR Assessment & Plan:  - eGFRs noted to be 63 and 67 on her most recent blood work 07/08/2021 and 03/20/2022 respectively.  - Did discuss this with patient and discussed that there is nothing to be done with it at this time except to control her blood pressure, continue to maintain a healthy diet and exercise.  - Will continue to monitor with routine blood work   Colon cancer screening declined Assessment & Plan: Patient declined referral for colonoscopy today.  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  She was advised to plan to have her colonoscopy done during the cooler months so dehydration would be a lower risk.  -We did discuss the possibility of ordering a Cologuard instead.  However, patient does have history of multiple polyps and so is not a candidate for Cologuard at this time.  - Due to the newness of the test, I am unable to order the [facility] screening at this time as well.  However, given the patient's history of polyps, it is not  as optimal an option as going ahead and having her colonoscopy scheduled and done in the upcoming fall/winter.     Return in about 6 months (around 03/18/2023) for HTN.      I discussed the assessment and treatment plan with the patient  The patient was provided an opportunity to ask questions and all were answered. The patient agreed with the plan and demonstrated an understanding of the instructions.   The patient was advised to call back or seek an in-person evaluation if the symptoms worsen or if the condition fails to improve as anticipated.    Sherlyn Hay, DO  [facility] [phone] (phone) [phone] (fax)  [facility]

## 2022-09-15 NOTE — Assessment & Plan Note (Signed)
Patient does endorse consistent blood pressures with systolics in the 120s to 130s over 80s at home, as well as when she checks using the sphygmomanometer at work.  Given this consistency while at home and her endorsement of anxiety when her blood pressures checked in the clinic, I will not adjust her blood pressure medications today.  I did advise her to bring her blood pressure cuff in to her next visit so we can compare to be completely sure her home blood pressures are in fact accurate.  Patient expressed understanding and stated she would bring it with her.

## 2022-09-15 NOTE — Assessment & Plan Note (Signed)
Noted on her recent eye exam to be stable.

## 2022-09-15 NOTE — Assessment & Plan Note (Signed)
-   eGFRs noted to be 63 and 67 on her most recent blood work 07/08/2021 and 03/20/2022 respectively.  - Did discuss this with patient and discussed that there is nothing to be done with it at this time except to control her blood pressure, continue to maintain a healthy diet and exercise.  - Will continue to monitor with routine blood work

## 2022-09-15 NOTE — Assessment & Plan Note (Addendum)
Patient declined referral for colonoscopy today.  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  She was advised to plan to have her colonoscopy done during the cooler months so dehydration would be a lower risk.  -We did discuss the possibility of ordering a Cologuard instead.  However, patient does have history of multiple polyps and so is not a candidate for Cologuard at this time.  - Due to the newness of the test, I am unable to order the [facility] screening at this time as well.  However, given the patient's history of polyps, it is not as optimal an option as going ahead and having her colonoscopy scheduled and done in the upcoming fall/winter.

## 2022-09-15 NOTE — Assessment & Plan Note (Signed)
Overall benign physical exam.  Will order routine lab work as noted.

## 2022-09-30 LAB — COMPREHENSIVE METABOLIC PANEL
ALT: 18 IU/L (ref 0–32)
AST: 15 IU/L (ref 0–40)
Albumin: 4.1 g/dL (ref 3.9–4.9)
Alkaline Phosphatase: 126 IU/L — ABNORMAL HIGH (ref 44–121)
BUN/Creatinine Ratio: 27 (ref 12–28)
BUN: 23 mg/dL (ref 8–27)
Bilirubin Total: <0.2 mg/dL (ref 0.0–1.2)
CO2: 20 mmol/L (ref 20–29)
Calcium: 9.3 mg/dL (ref 8.7–10.3)
Chloride: 101 mmol/L (ref 96–106)
Creatinine, Ser: 0.86 mg/dL (ref 0.57–1.00)
Globulin, Total: 2.9 g/dL (ref 1.5–4.5)
Glucose: 95 mg/dL (ref 70–99)
Potassium: 4.7 mmol/L (ref 3.5–5.2)
Sodium: 134 mmol/L (ref 134–144)
Total Protein: 7 g/dL (ref 6.0–8.5)
eGFR: 75 mL/min/{1.73_m2} (ref >59)

## 2022-09-30 LAB — VITAMIN B12: Vitamin B-12: 168 pg/mL — ABNORMAL LOW (ref 232–1245)

## 2022-09-30 LAB — MICROALBUMIN / CREATININE URINE RATIO
Creatinine, Urine: 37 mg/dL
Microalb/Creat Ratio: <8 mg/g{creat} (ref 0–29)
Microalbumin, Urine: <3 ug/mL

## 2022-09-30 LAB — LIPID PANEL
Chol/HDL Ratio: 3.1 ratio (ref 0.0–4.4)
Cholesterol, Total: 195 mg/dL (ref 100–199)
HDL: 63 mg/dL (ref >39)
LDL Chol Calc (NIH): 116 mg/dL — ABNORMAL HIGH (ref 0–99)
Triglycerides: 89 mg/dL (ref 0–149)
VLDL Cholesterol Cal: 16 mg/dL (ref 5–40)

## 2022-09-30 LAB — TSH RFX ON ABNORMAL TO FREE T4: TSH: 0.744 u[IU]/mL (ref 0.450–4.500)

## 2022-10-06 ENCOUNTER — Ambulatory Visit (INDEPENDENT_AMBULATORY_CARE_PROVIDER_SITE_OTHER): Payer: 59

## 2022-10-06 DIAGNOSIS — E538 Deficiency of other specified B group vitamins: Secondary | ICD-10-CM

## 2022-10-06 MED ORDER — CYANOCOBALAMIN 1000 MCG/ML IJ SOLN
1000.0000 ug | Freq: Once | INTRAMUSCULAR | Status: AC
Start: 2022-10-06 — End: 2022-10-06
  Administered 2022-10-06: 1000 ug via INTRAMUSCULAR

## 2022-10-13 ENCOUNTER — Ambulatory Visit (INDEPENDENT_AMBULATORY_CARE_PROVIDER_SITE_OTHER): Payer: 59

## 2022-10-13 DIAGNOSIS — E538 Deficiency of other specified B group vitamins: Secondary | ICD-10-CM | POA: Diagnosis not present

## 2022-10-13 MED ORDER — CYANOCOBALAMIN 1000 MCG/ML IJ SOLN
1000.0000 ug | Freq: Once | INTRAMUSCULAR | Status: AC
Start: 2022-10-13 — End: 2022-10-13
  Administered 2022-10-13: 1000 ug via INTRAMUSCULAR

## 2022-10-20 ENCOUNTER — Ambulatory Visit (INDEPENDENT_AMBULATORY_CARE_PROVIDER_SITE_OTHER): Payer: 59

## 2022-10-20 DIAGNOSIS — E538 Deficiency of other specified B group vitamins: Secondary | ICD-10-CM | POA: Diagnosis not present

## 2022-10-20 MED ORDER — CYANOCOBALAMIN 1000 MCG/ML IJ SOLN
1000.0000 ug | Freq: Once | INTRAMUSCULAR | Status: AC
Start: 2022-10-20 — End: 2022-10-20
  Administered 2022-10-20: 1000 ug via INTRAMUSCULAR

## 2022-10-27 ENCOUNTER — Ambulatory Visit (INDEPENDENT_AMBULATORY_CARE_PROVIDER_SITE_OTHER): Payer: 59

## 2022-10-27 DIAGNOSIS — E538 Deficiency of other specified B group vitamins: Secondary | ICD-10-CM

## 2022-10-27 MED ORDER — CYANOCOBALAMIN 1000 MCG/ML IJ SOLN
1000.0000 ug | Freq: Once | INTRAMUSCULAR | Status: AC
Start: 2022-10-27 — End: 2022-10-27
  Administered 2022-10-27: 1000 ug via INTRAMUSCULAR

## 2022-11-20 ENCOUNTER — Other Ambulatory Visit: Payer: Self-pay | Admitting: Family Medicine

## 2022-11-20 DIAGNOSIS — I10 Essential (primary) hypertension: Secondary | ICD-10-CM

## 2022-11-22 NOTE — Telephone Encounter (Signed)
Requested Prescriptions  Pending Prescriptions Disp Refills   lisinopril (ZESTRIL) 40 MG tablet [Pharmacy Med Name: LISINOPRIL 40 MG TABLET] 90 tablet 1    Sig: TAKE 1 TABLET BY MOUTH EVERY DAY     Cardiovascular:  ACE Inhibitors Failed - 11/20/2022  1:14 AM      Failed - Last BP in normal range    BP Readings from Last 1 Encounters:  09/15/22 (!) 158/80         Passed - Cr in normal range and within 180 days    Creatinine, Ser  Date Value Ref Range Status  09/29/2022 0.86 0.57 - 1.00 mg/dL Final         Passed - K in normal range and within 180 days    Potassium  Date Value Ref Range Status  09/29/2022 4.7 3.5 - 5.2 mmol/L Final         Passed - Patient is not pregnant      Passed - Valid encounter within last 6 months    Recent Outpatient Visits           2 months ago Essential (primary) hypertension   [facility] [place], Monico Blitz, DO   8 months ago Rash   [facility] Alfredia Ferguson, PA-C   8 months ago Essential (primary) hypertension   [place] [facility] Alfredia Ferguson, PA-C   1 year ago Encounter for annual physical exam   [facility] Alfredia Ferguson, PA-C   2 years ago Essential (primary) hypertension   [place] [facility] Bosie Clos, MD       Future Appointments             In 3 months P[place], Monico Blitz, DO [place] [facility]

## 2022-11-24 ENCOUNTER — Ambulatory Visit: Payer: 59

## 2023-03-18 ENCOUNTER — Ambulatory Visit: Payer: Self-pay | Admitting: Family Medicine

## 2023-04-15 ENCOUNTER — Ambulatory Visit: Payer: 59 | Admitting: Family Medicine

## 2023-06-02 ENCOUNTER — Other Ambulatory Visit: Payer: Self-pay | Admitting: Family Medicine

## 2023-06-02 DIAGNOSIS — I10 Essential (primary) hypertension: Secondary | ICD-10-CM

## 2023-06-02 NOTE — Telephone Encounter (Signed)
 Copied from CRM [id number]. Topic: Clinical - Medication Refill >> Jun 02, 2023  4:56 PM Turkey B wrote: Most Recent Primary Care Visit:  Provider: Diona Franklin D  Department: [id number]  Visit Type: NURSE VISIT  Date: 10/27/2022  Medication: Lisinopril  40 mg Med was refused previously, pt now has appt for June 5  Has the patient contacted their pharmacy? yes (Agent: If yes, when and what did the pharmacy advise?)contact pcp  Is this the correct pharmacy for this prescription? yes If no, delete pharmacy and type the correct one.  [facility] [address] [place] - [address] [place] [address] Phone: [phone] Fax: [phone]   Has the prescription been filled recently? no  Is the patient out of the medication? yes  Has the patient been seen for an appointment in the last year OR does the patient have an upcoming appointment? yes  Can we respond through MyChart? yes  Agent: Please be advised that Rx refills may take up to 3 business days. We ask that you follow-up with your pharmacy.

## 2023-06-06 MED ORDER — LISINOPRIL 40 MG PO TABS: 40.0000 mg | ORAL_TABLET | Freq: Every day | ORAL | 0 refills | Status: DC

## 2023-06-06 NOTE — Telephone Encounter (Signed)
 Requested medication (s) are due for refill today:   Yes  Requested medication (s) are on the active medication list:   Yes  Future visit scheduled:   Yes. 07/07/2023   Cancelled last 3 appts 11/24/2022, 03/28/2023 and 04/15/2023.   LOV 09/15/2022   Last ordered: 11/22/2022 #90, 1 refill  Unable to refill because labs and OV due.  Provider to review   Requested Prescriptions  Pending Prescriptions Disp Refills   lisinopril  (ZESTRIL ) 40 MG tablet 90 tablet 1    Sig: Take 1 tablet (40 mg total) by mouth daily.     Cardiovascular:  ACE Inhibitors Failed - 06/06/2023 12:02 PM      Failed - Cr in normal range and within 180 days    Creatinine, Ser  Date Value Ref Range Status  09/29/2022 0.86 0.57 - 1.00 mg/dL Final         Failed - K in normal range and within 180 days    Potassium  Date Value Ref Range Status  09/29/2022 4.7 3.5 - 5.2 mmol/L Final         Failed - Last BP in normal range    BP Readings from Last 1 Encounters:  09/15/22 (!) 158/80         Failed - Valid encounter within last 6 months    Recent Outpatient Visits   None            Passed - Patient is not pregnant

## 2023-07-07 ENCOUNTER — Encounter: Payer: Self-pay | Admitting: Family Medicine

## 2023-07-07 ENCOUNTER — Ambulatory Visit: Admitting: Family Medicine

## 2023-07-07 VITALS — BP 133/78 | HR 94 | Resp 18 | Ht 65.0 in | Wt 181.9 lb

## 2023-07-07 DIAGNOSIS — Z1212 Encounter for screening for malignant neoplasm of rectum: Secondary | ICD-10-CM

## 2023-07-07 DIAGNOSIS — I10 Essential (primary) hypertension: Secondary | ICD-10-CM

## 2023-07-07 DIAGNOSIS — Z1211 Encounter for screening for malignant neoplasm of colon: Secondary | ICD-10-CM

## 2023-07-07 DIAGNOSIS — M940 Chondrocostal junction syndrome [Tietze]: Secondary | ICD-10-CM | POA: Diagnosis not present

## 2023-07-07 DIAGNOSIS — Z1231 Encounter for screening mammogram for malignant neoplasm of breast: Secondary | ICD-10-CM

## 2023-07-07 MED ORDER — LISINOPRIL 40 MG PO TABS: 40.0000 mg | ORAL_TABLET | Freq: Every day | ORAL | 1 refills | Status: DC

## 2023-07-07 NOTE — Patient Instructions (Signed)
 Please call the [facility] [phone]) to schedule a routine screening mammogram.

## 2023-07-07 NOTE — Progress Notes (Signed)
 Established patient visit   [name]   DOB: 26-Oct-1955   68 y.o. Female  MRN: [id number] Visit Date: 07/07/2023  Today's healthcare provider: Carlean Charter, DO   Chief Complaint  Patient presents with   Medication Refill   Subjective    HPI Last annual exam: 09/15/2022  Karen Boyd is a 68 year old female who presents for medication refill and follow-up.  She experienced a two-day lapse in her lisinopril  medication due to a stomach bug. No chest pain, shortness of breath, dizziness, or lightheadedness, except for occasional dizziness when on the roof of her house, which she relates to being up high.  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 The pain is improving but is aggravated by sneezing and coughing. She notes a sore spot without a lump and finds relief by splinting the area.  She has not received the pneumonia vaccine and has only taken the initial two COVID-19 vaccines. She has not had a bone density scan and prefers not to undergo one. She receives regular mammograms, with the last one being last year, due to a previously found lump that was biopsied and found to be benign.  She transitioned from B12 injections to a daily oral gummy supplement, taking 3000 mg per day. Denies numbness and tingling.      Medications: Outpatient Medications Prior to Visit  Medication Sig   acetaminophen  (TYLENOL ) 500 MG tablet Take 500-1,000 mg by mouth daily as needed for moderate pain or headache.   [DISCONTINUED] lisinopril  (ZESTRIL ) 40 MG tablet Take 1 tablet (40 mg total) by mouth daily.   methocarbamol (ROBAXIN) 500 MG tablet Take 500 mg by mouth 3 (three) times daily.   No facility-administered medications prior to visit.    Review of Systems  Respiratory:  Negative for chest tightness and shortness of breath.    Cardiovascular:  Negative for chest pain.  Gastrointestinal:  Negative for abdominal pain, nausea and vomiting.  Neurological:  Negative for dizziness, weakness and headaches.        Objective    BP 133/78 (BP Location: Left Arm, Patient Position: Sitting, Cuff Size: Normal)   Pulse 94   Resp 18   Ht 5\' 5"  (1.651 m)   Wt 181 lb 14.4 oz (82.5 kg)   SpO2 98%   BMI 30.27 kg/m     Physical Exam Constitutional:      Appearance: Normal appearance.  HENT:     Head: Normocephalic and atraumatic.  Eyes:     General: No scleral icterus.    Extraocular Movements: Extraocular movements intact.     Conjunctiva/sclera: Conjunctivae normal.  Cardiovascular:     Rate and Rhythm: Normal rate and regular rhythm.     Pulses: Normal pulses.     Heart sounds: Normal heart sounds.  Pulmonary:     Effort: Pulmonary effort is normal. No respiratory distress.     Breath sounds: Normal breath sounds.  Musculoskeletal:     Right lower leg: No edema.     Left lower leg: No edema.  Skin:    General: Skin is warm and dry.  Neurological:     Mental Status: She is alert and oriented to person, place, and time. Mental status is at baseline.  Psychiatric:        Mood and Affect: Mood normal.  Behavior: Behavior normal.      No results found for any visits on 07/07/23.  Assessment & Plan    Essential (primary) hypertension -     Lisinopril ; Take 1 tablet (40 mg total) by mouth daily.  Dispense: 90 tablet; Refill: 1  Encounter for screening mammogram for breast cancer -     3D Screening Mammogram, Left and Right; Future  Encounter for colorectal cancer screening -     Ambulatory referral to Gastroenterology  Acute costochondritis    Hypertension Lisinopril  adherence was briefly interrupted due to illness.  Refill lisinopril  today. - [facility] [address]  Right lateral costochondritis Acute right-sided pain likely musculoskeletal, improving but  aggravated by sneezing and coughing. Splinting provides relief. No acute concerns.  Continue to monitor.    General Health Maintenance Due for colonoscopy and mammogram. Pneumonia vaccine and COVID-19 booster needed. Declined bone density scan. Transitioned to oral B12 with no deficiency symptoms. - Declines pneumonia vaccine and COVID-19 booster - Declined bone density scan.  - Schedule colonoscopy. - Order mammogram. - Include contact information for scheduling mammogram in after visit summary.   Return in about 3 months (around 10/07/2023) for CPE, mAWV w/AWV nurse (unless it's an initial AWV).      I discussed the assessment and treatment plan with the patient  The patient was provided an opportunity to ask questions and all were answered. The patient agreed with the plan and demonstrated an understanding of the instructions.   The patient was advised to call back or seek an in-person evaluation if the symptoms worsen or if the condition fails to improve as anticipated.    Carlean Charter, DO  [facility] [phone] (phone) [phone] (fax)  [facility]

## 2023-07-21 ENCOUNTER — Telehealth: Payer: Self-pay | Admitting: *Deleted

## 2023-07-21 ENCOUNTER — Other Ambulatory Visit: Payer: Self-pay | Admitting: *Deleted

## 2023-07-21 DIAGNOSIS — Z1211 Encounter for screening for malignant neoplasm of colon: Secondary | ICD-10-CM

## 2023-07-21 MED ORDER — NA SULFATE-K SULFATE-MG SULF 17.5-3.13-1.6 GM/177ML PO SOLN: 1.0000 | Freq: Once | ORAL | 0 refills | Status: AC

## 2023-07-21 NOTE — Telephone Encounter (Signed)
 Gastroenterology Pre-Procedure Review  Request Date: 09/13/2023 Requesting Physician: Dr. 09/13/2023  PATIENT REVIEW QUESTIONS: The patient responded to the following health history questions as indicated:    1. Are you having any GI issues? no 2. Do you have a personal history of Polyps? yes (Patient thinks that she had colon polyps from the last colonoscopy but not sure) 3. Do you have a family history of Colon Cancer or Polyps? no 4. Diabetes Mellitus? no 5. Joint replacements in the past 12 months?no 6. Major health problems in the past 3 months?no 7. Any artificial heart valves, MVP, or defibrillator?no    MEDICATIONS & ALLERGIES:    Patient reports the following regarding taking any anticoagulation/antiplatelet therapy:   Plavix, Coumadin, Eliquis, Xarelto, Lovenox, Pradaxa, Brilinta, or Effient? no Aspirin? no  Patient confirms/reports the following medications:  Current Outpatient Medications  Medication Sig Dispense Refill   acetaminophen  (TYLENOL ) 500 MG tablet Take 500-1,000 mg by mouth daily as needed for moderate pain or headache.     lisinopril  (ZESTRIL ) 40 MG tablet Take 1 tablet (40 mg total) by mouth daily. 90 tablet 1   Na Sulfate-K Sulfate-Mg Sulfate concentrate (SUPREP) 17.5-3.13-1.6 GM/177ML SOLN Take 1 kit (354 mLs total) by mouth once for 1 dose. 354 mL 0   No current facility-administered medications for this visit.    Patient confirms/reports the following allergies:  Allergies  Allergen Reactions   Shellfish Allergy Anaphylaxis   Shellfish-Derived Products Anaphylaxis    No orders of the defined types were placed in this encounter.   AUTHORIZATION INFORMATION Primary Insurance: 1D#: Group #:  Secondary Insurance: 1D#: Group #:  SCHEDULE INFORMATION: Date: 09/13/2023 Time: Location: [facility]

## 2023-09-09 ENCOUNTER — Telehealth: Payer: Self-pay

## 2023-09-09 NOTE — Telephone Encounter (Signed)
 Pt called to r/s procedure, called endo and moved to November 11th. No PA was needed so need to resend referral msg to [facility]  Just needs new PPW

## 2023-10-07 ENCOUNTER — Encounter: Admitting: Family Medicine

## 2023-12-05 ENCOUNTER — Ambulatory Visit (INDEPENDENT_AMBULATORY_CARE_PROVIDER_SITE_OTHER): Admitting: Family Medicine

## 2023-12-05 ENCOUNTER — Encounter: Payer: Self-pay | Admitting: Family Medicine

## 2023-12-05 VITALS — BP 163/85 | HR 87 | Temp 98.3°F | Ht 65.0 in | Wt 186.5 lb

## 2023-12-05 DIAGNOSIS — Z0000 Encounter for general adult medical examination without abnormal findings: Secondary | ICD-10-CM | POA: Diagnosis not present

## 2023-12-05 DIAGNOSIS — I10 Essential (primary) hypertension: Secondary | ICD-10-CM

## 2023-12-05 DIAGNOSIS — Z0001 Encounter for general adult medical examination with abnormal findings: Secondary | ICD-10-CM | POA: Diagnosis not present

## 2023-12-05 DIAGNOSIS — E538 Deficiency of other specified B group vitamins: Secondary | ICD-10-CM | POA: Insufficient documentation

## 2023-12-05 DIAGNOSIS — E782 Mixed hyperlipidemia: Secondary | ICD-10-CM

## 2023-12-05 DIAGNOSIS — E66811 Obesity, class 1: Secondary | ICD-10-CM | POA: Diagnosis not present

## 2023-12-05 DIAGNOSIS — Z789 Other specified health status: Secondary | ICD-10-CM

## 2023-12-05 NOTE — Assessment & Plan Note (Signed)
 Chronic, elevated today and at home. Discussed medication additon; patient prefers to focus on lifestyle modifications first due to recent decreased activity and dietary habits.

## 2023-12-05 NOTE — Progress Notes (Deleted)
      Established patient visit   [name]   DOB: Jun 25, 1955   68 y.o. Female  MRN: [id number] Visit Date: 12/05/2023  Today's healthcare provider: LAURAINE LOISE BUOY, DO   Chief Complaint  Patient presents with   Medicare Wellness    Patient is here today for Welcome to Medicare visit.   Subjective    HPI    ***  {History (Optional):23778}  Medications: Outpatient Medications Prior to Visit  Medication Sig   acetaminophen  (TYLENOL ) 500 MG tablet Take 500-1,000 mg by mouth daily as needed for moderate pain or headache.   lisinopril  (ZESTRIL ) 40 MG tablet Take 1 tablet (40 mg total) by mouth daily.   No facility-administered medications prior to visit.    Review of Systems ***  {Insert previous labs (optional):23779} {See past labs  Heme  Chem  Endocrine  Serology  Results Review (optional):1}   Objective    There were no vitals taken for this visit. {Insert last BP/Wt (optional):23777}{See vitals history (optional):1}   Physical Exam   No results found for any visits on 12/05/23.  Assessment & Plan    There are no diagnoses linked to this encounter.  ***  No follow-ups on file.      I discussed the assessment and treatment plan with the patient  The patient was provided an opportunity to ask questions and all were answered. The patient agreed with the plan and demonstrated an understanding of the instructions.   The patient was advised to call back or seek an in-person evaluation if the symptoms worsen or if the condition fails to improve as anticipated.    LAURAINE LOISE BUOY, DO  [facility] [phone] (phone) [phone] (fax)  [facility]    ______________________________________________________________________________

## 2023-12-05 NOTE — Progress Notes (Signed)
 Subjective:   Karen Boyd is a 68 y.o. female who presents for a Welcome to Medicare Exam.   Karen Boyd is a 68 year old female who presents for an annual wellness visit.  She has a history of left hip replacement and subsequent labral tear, which has limited her ability to walk. She is currently using a stationary bike three times a week and performing home exercises to manage her condition. She has gained approximately five to seven pounds since her last visit and is concerned about her weight gain. She is not willing to undergo another surgery for the labral tear and is managing her condition with home exercises.  She is on lisinopril  40 mg for blood pressure management and monitors her blood pressure at home, which typically ranges from 140/78 to 140/82 mmHg.  Her diet primarily consists of cheese, bread, and potatoes, and she identifies as a vegetarian with limited vegetable variety. She typically eats only dinner and acknowledges that her diet is high in starch. She has attempted to diversify her diet by planting a garden, but it was unsuccessful due to weather conditions.   Allergies (verified) Shellfish allergy and Shellfish protein-containing drug products   History: Past Medical History:  Diagnosis Date   Allergy 1987   Shellfish derived allergy   Cataract 2022   Surgically repaired   GERD (gastroesophageal reflux disease)    Hypertension    Past Surgical History:  Procedure Laterality Date   ABDOMINAL HYSTERECTOMY  2002   ANKLE SURGERY     APPENDECTOMY     cataract surgery x3  2020-2021   2 in 2020 and 1 in 2021   CHOLECYSTECTOMY  2022   ELBOW SURGERY Left    EYE SURGERY  2022   Cataract correction   JOINT REPLACEMENT  2023   Left hip replacement   TOTAL ABDOMINAL HYSTERECTOMY W/ BILATERAL SALPINGOOPHORECTOMY  08/16/2001   large 18 cm leiomyoma   Family History  Problem Relation Age of Onset   Heart failure Father    Cancer Father    Ovarian  cancer Maternal Grandmother    Cirrhosis Paternal Grandfather    Cancer Brother    Diabetes Brother    Hypertension Mother    Diabetes Mother    Gout Mother    Arthritis Mother    Cancer Mother    Social History   Occupational History   Not on file  Tobacco Use   Smoking status: Never   Smokeless tobacco: Never  Vaping Use   Vaping status: Never Used  Substance and Sexual Activity   Alcohol use: No   Drug use: No   Sexual activity: Not Currently    Birth control/protection: Abstinence   Tobacco Counseling Counseling given: N/A  SDOH Screenings   Food Insecurity: No Food Insecurity (12/01/2023)  Housing: Low Risk  (12/01/2023)  Transportation Needs: No Transportation Needs (12/01/2023)  Alcohol Screen: Low Risk  (09/15/2022)  Depression (PHQ2-9): Low Risk  (12/05/2023)  Financial Resource Strain: Low Risk  (12/01/2023)  Physical Activity: Insufficiently Active (12/01/2023)  Social Connections: Socially Isolated (12/01/2023)  Stress: No Stress Concern Present (12/01/2023)  Tobacco Use: Low Risk  (12/05/2023)  Health Literacy: Adequate Health Literacy (07/07/2023)   Depression Screen    12/05/2023    9:17 AM 12/05/2023    9:15 AM 07/07/2023    8:14 AM 09/15/2022    8:30 AM 03/17/2022    8:49 AM 02/26/2022    8:37 AM 07/08/2021    9:07 AM  PHQ  2/9 Scores  PHQ - 2 Score 0 0 0 0 0 0 0  PHQ- 9 Score 0 0 0 0 2 2 0     Goals Addressed             This Visit's Progress    Increase physical activity       Work to become more flexible again.     Lose 20 pounds         Visit info / Clinical Intake: Interpreter Needed?: No  Functional Status Activities of Daily Living (to include ambulation/medication): Independent Ambulation: Independent Medication Administration: Independent Home Management: Independent  Fall Screening Falls in the past year?: 0 Number of falls in past year: 0 Was there an injury with Fall?: 0 Fall Risk Category Calculator: 0 Patient Fall  Risk Level: Low Fall Risk  Fall Risk Patient at Risk for Falls Due to: No Fall Risks  Cognitive Assessment What year is it?: 0 points What month is it?: 0 points Give patient an address phrase to remember (5 components): Karen Boyd [address] About what time is it?: 0 points Count backwards from 20 to 1: 0 points Say the months of the year in reverse: 0 points Repeat the address phrase from earlier: 0 points 6 CIT Score: 0 points         Objective:    Today's Vitals   12/05/23 0855 12/05/23 0920  BP: (!) 163/79 (!) 163/85  Pulse: 91 87  Temp: 98.3 F (36.8 C)   TempSrc: Oral   SpO2: 100%   Weight: 186 lb 8 oz (84.6 kg)   Height: 5' 5 (1.651 m)    Body mass index is 31.04 kg/m.   Physical Exam Vitals and nursing note reviewed.  Constitutional:      General: She is awake.     Appearance: Normal appearance.  HENT:     Head: Normocephalic and atraumatic.     Right Ear: Tympanic membrane, ear canal and external ear normal.     Left Ear: Tympanic membrane, ear canal and external ear normal.     Nose: Nose normal.     Mouth/Throat:     Mouth: Mucous membranes are moist.     Pharynx: Oropharynx is clear. No oropharyngeal exudate or posterior oropharyngeal erythema.  Eyes:     General: No scleral icterus.    Extraocular Movements: Extraocular movements intact.     Conjunctiva/sclera: Conjunctivae normal.     Pupils: Pupils are equal, round, and reactive to light.  Neck:     Thyroid: No thyromegaly or thyroid tenderness.  Cardiovascular:     Rate and Rhythm: Normal rate and regular rhythm.     Pulses: Normal pulses.     Heart sounds: Normal heart sounds.  Pulmonary:     Effort: Pulmonary effort is normal. No tachypnea, bradypnea or respiratory distress.     Breath sounds: Normal breath sounds. No stridor. No wheezing, rhonchi or rales.  Abdominal:     General: Bowel sounds are normal. There is no distension.     Palpations: Abdomen is soft. There is no  mass.     Tenderness: There is no abdominal tenderness. There is no guarding.     Hernia: No hernia is present.  Musculoskeletal:     Cervical back: Normal range of motion and neck supple.     Right lower leg: No edema.     Left lower leg: No edema.  Lymphadenopathy:     Cervical: No cervical adenopathy.  Skin:    General: Skin is warm and dry.  Neurological:     Mental Status: She is alert and oriented to person, place, and time. Mental status is at baseline.  Psychiatric:        Mood and Affect: Mood normal.        Behavior: Behavior normal.    Current Medications (verified) Outpatient Encounter Medications as of 12/05/2023  Medication Sig   acetaminophen  (TYLENOL ) 500 MG tablet Take 500-1,000 mg by mouth daily as needed for moderate pain or headache.   lisinopril  (ZESTRIL ) 40 MG tablet Take 1 tablet (40 mg total) by mouth daily.   No facility-administered encounter medications on file as of 12/05/2023.   Hearing/Vision screen No results found. Immunizations and Health Maintenance Health Maintenance  Topic Date Due   Colonoscopy  12/13/2023 (Originally 10/24/2011)   Influenza Vaccine  05/01/2024 (Originally 09/02/2023)   Pneumococcal Vaccine: 50+ Years (1 of 1 - PCV) 07/06/2024 (Originally 12/21/2007)   DEXA SCAN  07/06/2024 (Originally 12/21/2022)   COVID-19 Vaccine (3 - 2025-26 season) 10/02/2024 (Originally 10/03/2023)   Zoster Vaccines- Shingrix (1 of 2) 10/02/2024 (Originally 12/21/2007)   Mammogram  07/20/2024   Medicare Annual Wellness (AWV)  12/04/2024   DTaP/Tdap/Td (2 - Tdap) 07/09/2031   Hepatitis C Screening  Completed   Hepatitis B Vaccines 19-59 Average Risk  Aged Out   Meningococcal B Vaccine  Aged Out   HIV Screening  Discontinued    EKG: normal EKG, normal sinus rhythm, unchanged from previous tracings     Assessment/Plan:  This is a routine wellness examination for Karen Boyd.  Medicare Annual Wellness vist Annual wellness visit completed. Immunizations  discussed, declined flu, shingles vaccines, and COVID booster. Colon cancer screening scheduled. Depression and anxiety screenings negative. No diabetes, manages medications and household tasks independently. No physical threats or abuse. - Proceed with colon cancer screening (colonoscopy) as scheduled. - Administer COVID booster as planned.  Essential hypertension Hypertension managed with lisinopril  40 mg. Home blood pressure readings range from 140/78 to 140/82. Prefers lifestyle modifications over medication changes. Acknowledges dietary salt intake as a contributing factor. - Implement lifestyle modifications including dietary changes and increased physical activity. - Monitor blood pressure more frequently. - Reassess in six months or sooner if blood pressure does not improve.  Obesity (BMI 30.0-34.9) Weight gain of 5-7 pounds since last visit. Elevated BMI. Desires to lose 20 pounds and increase physical activity. Current diet high in starches, limited vegetable intake. - Encourage dietary modifications to reduce starch intake and increase vegetable consumption. - Continue current exercise regimen and consider increasing frequency. - Monitor weight and progress.  Vegetarian diet with limited vegetable intake Vegetarian diet primarily consisting of cheese, bread, and potatoes. Limited variety of vegetables consumed. Attempts to diversify diet with home gardening, but faced challenges. - Encourage diversification of diet to include a wider variety of vegetables. - Consider alternative protein sources with lower sodium content.  Left hip labral tear, status post hip replacement Left hip labral tear post hip replacement. Declines further surgery. Engages in home exercises to manage condition. Uses stationary bike and resistance bands for exercise. - Continue home exercise program. - Consider physical therapy if needed for guided exercise.  Patient to contact clinic if she would like a  referral for physical therapy.    Patient Care Team: Donzella Lauraine SAILOR, DO as PCP - General (Family Medicine)  I have personally reviewed and noted the following in the patient's chart:   Medical and social history Use  of alcohol, tobacco or illicit drugs  Current medications and supplements including opioid prescriptions. Functional ability and status Nutritional status Physical activity Advanced directives List of other physicians Hospitalizations, surgeries, and ER visits in previous 12 months Vitals Screenings to include cognitive, depression, and falls Referrals and appointments  Orders Placed This Encounter  Procedures   EKG 12-Lead   In addition, I have reviewed and discussed with patient certain preventive protocols, quality metrics, and best practice recommendations. A written personalized care plan for preventive services as well as general preventive health recommendations were provided to patient.   Karen Moure N Yarelli Decelles, DO   12/05/2023   Return in about 6 months (around 06/03/2024) for HTN, Weight.

## 2023-12-05 NOTE — Patient Instructions (Signed)
 Bone Density Test: What to Expect A bone density test uses a type of X-ray to measure the amount of calcium and other minerals in your bones. It can measure bone density in the hip and the spine. This test may also be called: Bone densitometry. Bone mineral density test. Dual-energy X-ray absorptiometry (DEXA). You may have this test to: Diagnose or screen for a condition that causes weak or thin bones, called osteoporosis. See what your risk is for a broken bone, also called a fracture. Check how well your treatment for weak or thin bones is working. The test is similar to having a regular X-ray. Tell a health care provider about: Any allergies you have. All medicines you're taking, including vitamins, herbs, eye drops, creams, and over-the-counter medicines. Any problems you or family members have had with anesthesia. Any bleeding problems you have. Any surgeries you've had. Any medical conditions you have. Whether you're pregnant or may be pregnant. Any medical tests you've had within the past 14 days that used contrast. What are the risks? Your health care provider will talk with you about risks. These may include: Being exposed to a small amount of radiation. This can slightly increase your cancer risk. What happens before the test? Do not take any calcium supplements within the 24 hours before your test. You'll need to take off: All metal jewelry. Eyeglasses. Removable dental appliances. Any other metal objects on your body. What happens during the test?  You'll lie down on an exam table. There will be an X-ray machine below you and an imaging device above you. Other devices, such as boxes or braces, may be used to position your body for the scan. The machine will slowly scan your body. You'll need to keep very still while the machine does the scan. The images will show up on a screen in the room. Images will be checked by a specialist after your test is finished. These  steps may vary. Ask what you can expect. What can I expect after the test? Ask when your results will be ready and how to get them. You may need to call or meet with your provider to get your results. This information is not intended to replace advice given to you by your health care provider. Make sure you discuss any questions you have with your health care provider. Document Revised: 05/30/2022 Document Reviewed: 05/30/2022 Elsevier Patient Education  2024 [employer] Health Maintenance, Female Adopting a healthy lifestyle and getting preventive care are important in promoting health and wellness. Ask your health care provider about: The right schedule for you to have regular tests and exams. Things you can do on your own to prevent diseases and keep yourself healthy. What should I know about diet, weight, and exercise? Eat a healthy diet  Eat a diet that includes plenty of vegetables, fruits, low-fat dairy products, and lean protein. Do not eat a lot of foods that are high in solid fats, added sugars, or sodium. Maintain a healthy weight Body mass index (BMI) is used to identify weight problems. It estimates body fat based on height and weight. Your health care provider can help determine your BMI and help you achieve or maintain a healthy weight. Get regular exercise Get regular exercise. This is one of the most important things you can do for your health. Most adults should: Exercise for at least 150 minutes each week. The exercise should increase your heart rate and make you sweat (moderate-intensity exercise). Do strengthening exercises at least  twice a week. This is in addition to the moderate-intensity exercise. Spend less time sitting. Even light physical activity can be beneficial. Watch cholesterol and blood lipids Have your blood tested for lipids and cholesterol at 66 years of age, then have this test every 5 years. Have your cholesterol levels checked more often if: Your  lipid or cholesterol levels are high. You are older than 66 years of age. You are at high risk for heart disease. What should I know about cancer screening? Depending on your health history and family history, you may need to have cancer screening at various ages. This may include screening for: Breast cancer. Cervical cancer. Colorectal cancer. Skin cancer. Lung cancer. What should I know about heart disease, diabetes, and high blood pressure? Blood pressure and heart disease High blood pressure causes heart disease and increases the risk of stroke. This is more likely to develop in people who have high blood pressure readings or are overweight. Have your blood pressure checked: Every 3-5 years if you are 9-41 years of age. Every year if you are 59 years old or older. Diabetes Have regular diabetes screenings. This checks your fasting blood sugar level. Have the screening done: Once every three years after age 14 if you are at a normal weight and have a low risk for diabetes. More often and at a younger age if you are overweight or have a high risk for diabetes. What should I know about preventing infection? Hepatitis B If you have a higher risk for hepatitis B, you should be screened for this virus. Talk with your health care provider to find out if you are at risk for hepatitis B infection. Hepatitis C Testing is recommended for: Everyone born from [birth year] through 1963. Anyone with known risk factors for hepatitis C. Sexually transmitted infections (STIs) Get screened for STIs, including gonorrhea and chlamydia, if: You are sexually active and are younger than 66 years of age. You are older than 66 years of age and your health care provider tells you that you are at risk for this type of infection. Your sexual activity has changed since you were last screened, and you are at increased risk for chlamydia or gonorrhea. Ask your health care provider if you are at risk. Ask your health  care provider about whether you are at high risk for HIV. Your health care provider may recommend a prescription medicine to help prevent HIV infection. If you choose to take medicine to prevent HIV, you should first get tested for HIV. You should then be tested every 3 months for as long as you are taking the medicine. Pregnancy If you are about to stop having your period (premenopausal) and you may become pregnant, seek counseling before you get pregnant. Take 400 to 800 micrograms (mcg) of folic acid every day if you become pregnant. Ask for birth control (contraception) if you want to prevent pregnancy. Osteoporosis and menopause Osteoporosis is a disease in which the bones lose minerals and strength with aging. This can result in bone fractures. If you are 26 years old or older, or if you are at risk for osteoporosis and fractures, ask your health care provider if you should: Be screened for bone loss. Take a calcium or vitamin D supplement to lower your risk of fractures. Be given hormone replacement therapy (HRT) to treat symptoms of menopause. Follow these instructions at home: Alcohol use Do not drink alcohol if: Your health care provider tells you not to drink. You are pregnant,  may be pregnant, or are planning to become pregnant. If you drink alcohol: Limit how much you have to: 0-1 drink a day. Know how much alcohol is in your drink. In the [place] one drink equals one 12 oz bottle of beer (355 mL), one 5 oz glass of wine (148 mL), or one 1 oz glass of hard liquor (44 mL). Lifestyle Do not use any products that contain nicotine or tobacco. These products include cigarettes, chewing tobacco, and vaping devices, such as e-cigarettes. If you need help quitting, ask your health care provider. Do not use street drugs. Do not share needles. Ask your health care provider for help if you need support or information about quitting drugs. General instructions Schedule regular health,  dental, and eye exams. Stay current with your vaccines. Tell your health care provider if: You often feel depressed. You have ever been abused or do not feel safe at home. Summary Adopting a healthy lifestyle and getting preventive care are important in promoting health and wellness. Follow your health care provider's instructions about healthy diet, exercising, and getting tested or screened for diseases. Follow your health care provider's instructions on monitoring your cholesterol and blood pressure. This information is not intended to replace advice given to you by your health care provider. Make sure you discuss any questions you have with your health care provider. Document Revised: 06/09/2020 Document Reviewed: 06/09/2020 Elsevier Patient Education  2024 [employer].

## 2023-12-08 ENCOUNTER — Telehealth: Payer: Self-pay

## 2023-12-08 NOTE — Telephone Encounter (Signed)
 Left voice message to let pt know her message was received to cancel her 12/13/23 colonoscopy with Dr. Jinny (due to transportation).  Trish in Endo notified.  Thanks,  Witt, CMA

## 2023-12-13 ENCOUNTER — Ambulatory Visit: Admission: RE | Admit: 2023-12-13 | Source: Home / Self Care | Admitting: Gastroenterology

## 2023-12-13 SURGERY — COLONOSCOPY
Anesthesia: General

## 2023-12-30 ENCOUNTER — Telehealth: Payer: Self-pay

## 2024-02-26 ENCOUNTER — Other Ambulatory Visit: Payer: Self-pay | Admitting: Family Medicine

## 2024-02-26 DIAGNOSIS — I10 Essential (primary) hypertension: Secondary | ICD-10-CM

## 2024-02-27 NOTE — Telephone Encounter (Signed)
 Requested Prescriptions  Pending Prescriptions Disp Refills   lisinopril  (ZESTRIL ) 40 MG tablet [Pharmacy Med Name: LISINOPRIL  40 MG TABLET] 90 tablet 1    Sig: TAKE 1 TABLET BY MOUTH EVERY DAY     Cardiovascular:  ACE Inhibitors Failed - 02/27/2024  2:02 PM      Failed - Cr in normal range and within 180 days    Creatinine, Ser  Date Value Ref Range Status  09/29/2022 0.86 0.57 - 1.00 mg/dL Final         Failed - K in normal range and within 180 days    Potassium  Date Value Ref Range Status  09/29/2022 4.7 3.5 - 5.2 mmol/L Final         Failed - Last BP in normal range    BP Readings from Last 1 Encounters:  12/05/23 (!) 163/85         Passed - Patient is not pregnant      Passed - Valid encounter within last 6 months    Recent Outpatient Visits           2 months ago 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

## 2024-06-01 ENCOUNTER — Ambulatory Visit: Admitting: Family Medicine
